# Patient Record
Sex: Male | Born: 1970 | Race: White | Hispanic: No | Smoking: Former smoker
Health system: Southern US, Community
[De-identification: ages and names within clinical notes are randomized; demographics above are authoritative.]

## PROBLEM LIST (undated history)

## (undated) DIAGNOSIS — M109 Gout, unspecified: Secondary | ICD-10-CM

## (undated) DIAGNOSIS — R7303 Prediabetes: Secondary | ICD-10-CM

## (undated) DIAGNOSIS — T7840XA Allergy, unspecified, initial encounter: Secondary | ICD-10-CM

## (undated) DIAGNOSIS — I10 Essential (primary) hypertension: Secondary | ICD-10-CM

## (undated) HISTORY — DX: Gout, unspecified: M10.9

## (undated) HISTORY — PX: WISDOM TOOTH EXTRACTION: SHX21

## (undated) HISTORY — DX: Prediabetes: R73.03

## (undated) HISTORY — DX: Allergy, unspecified, initial encounter: T78.40XA

---

## 2003-06-01 ENCOUNTER — Emergency Department (HOSPITAL_COMMUNITY): Admission: EM | Admit: 2003-06-01 | Discharge: 2003-06-01 | Payer: Self-pay | Admitting: Emergency Medicine

## 2004-12-26 ENCOUNTER — Ambulatory Visit (HOSPITAL_COMMUNITY): Admission: RE | Admit: 2004-12-26 | Discharge: 2004-12-26 | Payer: Self-pay | Admitting: Colon and Rectal Surgery

## 2006-03-03 HISTORY — PX: NASAL SEPTUM SURGERY: SHX37

## 2008-06-26 ENCOUNTER — Encounter: Admission: RE | Admit: 2008-06-26 | Discharge: 2008-06-26 | Payer: Self-pay | Admitting: Chiropractic Medicine

## 2010-07-19 NOTE — Op Note (Signed)
Andre Garrett, Andre Garrett                 ACCOUNT NO.:  [id number]   MEDICAL RECORD NO.:  [id number]          PATIENT TYPE:  AMB   LOCATION:  SDS                          FACILITY:  [facility]   PHYSICIAN:  Jefry H. Pollyann Kennedy, MD     DATE OF BIRTH:  [birth year]   DATE OF PROCEDURE:  12/26/2004  DATE OF DISCHARGE:  12/26/2004                                 OPERATIVE REPORT   PREOPERATIVE DIAGNOSIS:  Intractable epistaxis with septal deviation.   POSTOPERATIVE DIAGNOSIS:  Intractable epistaxis with septal deviation.   PROCEDURE:  Nasal septoplasty with control of epistaxis.   SURGEON:  Jefry H. Pollyann Kennedy, MD   HISTORY:  This is a 40 year old gentleman who presented to the office  earlier today with a 5-day history of intractable epistaxis from the right  side.  In the office I was able to identify the bleeding source as emanating  from underneath and lateral to the right inferior turbinate, although this  area was very difficult to visualize because of the tight space as well as a  significant septal deviation on the right side.  Risks, benefit,  alternatives and complications to the procedure were explained to the  patient, who seemed to understand and agreed to surgery.   PROCEDURE:  The patient was taken to the operating room and placed on the  table in a supine position.  Following induction of general endotracheal  anesthesia, the face was prepped and draped in a standard fashion.  Oxymetazoline spray was used preoperatively in the nasal cavities.  1%  Xylocaine with epinephrine was infiltrated into the septum, the columella  and inferior turbinates bilaterally.  Afrin-soaked pledgets were placed  bilaterally in the nasal cavities.  A left hemitransfixion incision was  created with a 15 scalpel and a mucoperichondrial flap was developed down  the left side of the nasal septum.  The bony cartilaginous junction was  divided and a similar flap was developed anteriorly on the right side.  The  spur  was comprised of the lower portion of the quadrangle cartilage and a  severely tilted maxillary crest.  All of the abnormal bone and some of the  quadrangle cartilage were removed to allow the septum to lie at the midline.  Suction cautery was used for hemostasis at the maxillary crest osteotomy  site.  Once the septum was able to lie at the midline, the septal incision  was reapproximated with chromic suture.  The septal flaps were then quilted  with plain gut.  The inferior turbinate was then infractured, allowing  greater access to the inferior surface.  The bleeding site was identified  and cauterized extensively with a suction cautery unit.  The inferior meatus  was then packed with Surgicel.  The turbinate was then outfractured to  compress the Surgicel and put pressure on the bleeding site.  The nasal  cavities were then packed with rolled-up Telfa coated with bacitracin.  The  pharynx was suctioned of blood and secretions.  The patient was then  awakened, extubated, transferred to Recovery.      Jefry H. Pollyann Kennedy, MD  Electronically Signed     JHR/MEDQ  D:  12/27/2004  T:  12/28/2004  Job:  [id number]

## 2010-07-26 ENCOUNTER — Emergency Department (HOSPITAL_COMMUNITY)
Admission: EM | Admit: 2010-07-26 | Discharge: 2010-07-26 | Disposition: A | Discharge status: Home / Self Care | Payer: Worker's Compensation | Attending: Emergency Medicine | Admitting: Emergency Medicine

## 2010-07-26 DIAGNOSIS — W268XXA Contact with other sharp object(s), not elsewhere classified, initial encounter: Secondary | ICD-10-CM | POA: Insufficient documentation

## 2010-07-26 DIAGNOSIS — Y9289 Other specified places as the place of occurrence of the external cause: Secondary | ICD-10-CM | POA: Insufficient documentation

## 2010-07-26 DIAGNOSIS — S61209A Unspecified open wound of unspecified finger without damage to nail, initial encounter: Secondary | ICD-10-CM | POA: Insufficient documentation

## 2010-07-26 DIAGNOSIS — I10 Essential (primary) hypertension: Secondary | ICD-10-CM | POA: Insufficient documentation

## 2012-08-21 ENCOUNTER — Emergency Department (HOSPITAL_COMMUNITY): Payer: Self-pay

## 2012-08-21 ENCOUNTER — Observation Stay (HOSPITAL_COMMUNITY)
Admission: EM | Admit: 2012-08-21 | Discharge: 2012-08-22 | Disposition: A | Discharge status: Home / Self Care | Payer: MEDICAID | Attending: Internal Medicine | Admitting: Internal Medicine

## 2012-08-21 ENCOUNTER — Encounter (HOSPITAL_COMMUNITY): Payer: Self-pay | Admitting: *Deleted

## 2012-08-21 DIAGNOSIS — R079 Chest pain, unspecified: Secondary | ICD-10-CM

## 2012-08-21 DIAGNOSIS — R911 Solitary pulmonary nodule: Secondary | ICD-10-CM | POA: Diagnosis present

## 2012-08-21 DIAGNOSIS — F17200 Nicotine dependence, unspecified, uncomplicated: Secondary | ICD-10-CM | POA: Insufficient documentation

## 2012-08-21 DIAGNOSIS — J309 Allergic rhinitis, unspecified: Secondary | ICD-10-CM | POA: Diagnosis present

## 2012-08-21 DIAGNOSIS — G4700 Insomnia, unspecified: Secondary | ICD-10-CM | POA: Insufficient documentation

## 2012-08-21 DIAGNOSIS — F1010 Alcohol abuse, uncomplicated: Secondary | ICD-10-CM | POA: Diagnosis present

## 2012-08-21 DIAGNOSIS — I10 Essential (primary) hypertension: Secondary | ICD-10-CM | POA: Diagnosis present

## 2012-08-21 DIAGNOSIS — G589 Mononeuropathy, unspecified: Secondary | ICD-10-CM | POA: Insufficient documentation

## 2012-08-21 DIAGNOSIS — R0789 Other chest pain: Principal | ICD-10-CM | POA: Insufficient documentation

## 2012-08-21 DIAGNOSIS — Z720 Tobacco use: Secondary | ICD-10-CM | POA: Diagnosis present

## 2012-08-21 HISTORY — DX: Essential (primary) hypertension: I10

## 2012-08-21 LAB — CBC WITH DIFFERENTIAL/PLATELET
Basophils Absolute: 0 10*3/uL (ref 0.0–0.1)
Basophils Relative: 1 % (ref 0–1)
HCT: 45.7 % (ref 39.0–52.0)
Lymphocytes Relative: 30 % (ref 12–46)
MCHC: 35.7 g/dL (ref 30.0–36.0)
Monocytes Absolute: 0.6 10*3/uL (ref 0.1–1.0)
Neutro Abs: 3.9 10*3/uL (ref 1.7–7.7)
Platelets: 166 10*3/uL (ref 150–400)
RDW: 13.3 % (ref 11.5–15.5)
WBC: 6.6 10*3/uL (ref 4.0–10.5)

## 2012-08-21 LAB — POCT I-STAT TROPONIN I: Troponin i, poc: 0.01 ng/mL (ref 0.00–0.08)

## 2012-08-21 LAB — HEPATIC FUNCTION PANEL
AST: 20 U/L (ref 0–37)
Albumin: 3.6 g/dL (ref 3.5–5.2)
Alkaline Phosphatase: 74 U/L (ref 39–117)
Total Bilirubin: 1 mg/dL (ref 0.3–1.2)

## 2012-08-21 LAB — BASIC METABOLIC PANEL
Calcium: 9.1 mg/dL (ref 8.4–10.5)
Chloride: 102 mEq/L (ref 96–112)
Creatinine, Ser: 0.88 mg/dL (ref 0.50–1.35)
GFR calc Af Amer: >90 mL/min (ref >90)
Sodium: 138 mEq/L (ref 135–145)

## 2012-08-21 LAB — CBC
HCT: 44.6 % (ref 39.0–52.0)
Hemoglobin: 15.6 g/dL (ref 13.0–17.0)
MCV: 94.1 fL (ref 78.0–100.0)
RBC: 4.74 MIL/uL (ref 4.22–5.81)
WBC: 8 10*3/uL (ref 4.0–10.5)

## 2012-08-21 LAB — RAPID URINE DRUG SCREEN, HOSP PERFORMED
Cocaine: NOT DETECTED
Opiates: POSITIVE — AB

## 2012-08-21 LAB — TROPONIN I: Troponin I: <0.3 ng/mL (ref <0.30)

## 2012-08-21 MED ORDER — FOLIC ACID 1 MG PO TABS
1.0000 mg | ORAL_TABLET | Freq: Every day | ORAL | Status: DC
  Administered 2012-08-21 – 2012-08-22 (×2): 1 mg via ORAL
  Filled 2012-08-21 (×2): qty 1

## 2012-08-21 MED ORDER — CARVEDILOL 12.5 MG PO TABS
12.5000 mg | ORAL_TABLET | Freq: Two times a day (BID) | ORAL | Status: DC
  Administered 2012-08-21 – 2012-08-22 (×2): 12.5 mg via ORAL
  Filled 2012-08-21 (×4): qty 1

## 2012-08-21 MED ORDER — ACETAMINOPHEN 325 MG PO TABS: 650.0000 mg | ORAL_TABLET | Freq: Four times a day (QID) | ORAL | Status: DC | PRN

## 2012-08-21 MED ORDER — METOPROLOL TARTRATE 25 MG PO TABS
25.0000 mg | ORAL_TABLET | Freq: Two times a day (BID) | ORAL | Status: DC
  Administered 2012-08-21: 25 mg via ORAL
  Filled 2012-08-21 (×4): qty 1

## 2012-08-21 MED ORDER — VITAMIN B-1 100 MG PO TABS
100.0000 mg | ORAL_TABLET | Freq: Every day | ORAL | Status: DC
  Administered 2012-08-21 – 2012-08-22 (×2): 100 mg via ORAL
  Filled 2012-08-21 (×2): qty 1

## 2012-08-21 MED ORDER — LORAZEPAM 2 MG/ML IJ SOLN: 0.0000 mg | Freq: Four times a day (QID) | INTRAMUSCULAR | Status: DC

## 2012-08-21 MED ORDER — LORAZEPAM 1 MG PO TABS: 1.0000 mg | ORAL_TABLET | Freq: Four times a day (QID) | ORAL | Status: DC | PRN

## 2012-08-21 MED ORDER — SODIUM CHLORIDE 0.9 % IJ SOLN
3.0000 mL | Freq: Two times a day (BID) | INTRAMUSCULAR | Status: DC
  Administered 2012-08-21: 3 mL via INTRAVENOUS

## 2012-08-21 MED ORDER — ASPIRIN EC 81 MG PO TBEC
81.0000 mg | DELAYED_RELEASE_TABLET | Freq: Every day | ORAL | Status: DC
  Administered 2012-08-22: 81 mg via ORAL
  Filled 2012-08-21: qty 1

## 2012-08-21 MED ORDER — PANTOPRAZOLE SODIUM 40 MG PO TBEC: 40.0000 mg | DELAYED_RELEASE_TABLET | Freq: Every day | ORAL | Status: DC

## 2012-08-21 MED ORDER — NICOTINE 21 MG/24HR TD PT24
21.0000 mg | MEDICATED_PATCH | Freq: Every day | TRANSDERMAL | Status: DC
  Administered 2012-08-21 – 2012-08-22 (×2): 21 mg via TRANSDERMAL
  Filled 2012-08-21 (×2): qty 1

## 2012-08-21 MED ORDER — ONDANSETRON HCL 4 MG PO TABS: 4.0000 mg | ORAL_TABLET | Freq: Four times a day (QID) | ORAL | Status: DC | PRN

## 2012-08-21 MED ORDER — ACETAMINOPHEN 650 MG RE SUPP: 650.0000 mg | Freq: Four times a day (QID) | RECTAL | Status: DC | PRN

## 2012-08-21 MED ORDER — OXYCODONE HCL 5 MG PO TABS: 5.0000 mg | ORAL_TABLET | ORAL | Status: DC | PRN

## 2012-08-21 MED ORDER — HEPARIN SODIUM (PORCINE) 5000 UNIT/ML IJ SOLN
5000.0000 [IU] | Freq: Three times a day (TID) | INTRAMUSCULAR | Status: DC
  Administered 2012-08-21 – 2012-08-22 (×3): 5000 [IU] via SUBCUTANEOUS
  Filled 2012-08-21 (×6): qty 1

## 2012-08-21 MED ORDER — NITROGLYCERIN 0.4 MG SL SUBL
0.4000 mg | SUBLINGUAL_TABLET | SUBLINGUAL | Status: DC | PRN
  Administered 2012-08-21: 0.4 mg via SUBLINGUAL

## 2012-08-21 MED ORDER — VITAMIN B-1 100 MG PO TABS: 100.0000 mg | ORAL_TABLET | Freq: Every day | ORAL | Status: DC

## 2012-08-21 MED ORDER — ADULT MULTIVITAMIN W/MINERALS CH
1.0000 | ORAL_TABLET | Freq: Every day | ORAL | Status: DC
  Administered 2012-08-21 – 2012-08-22 (×2): 1 via ORAL
  Filled 2012-08-21 (×2): qty 1

## 2012-08-21 MED ORDER — LORATADINE 10 MG PO TABS
10.0000 mg | ORAL_TABLET | Freq: Every day | ORAL | Status: DC
  Administered 2012-08-22: 10 mg via ORAL
  Filled 2012-08-21: qty 1

## 2012-08-21 MED ORDER — THIAMINE HCL 100 MG/ML IJ SOLN
100.0000 mg | Freq: Every day | INTRAMUSCULAR | Status: DC
  Filled 2012-08-21 (×2): qty 1

## 2012-08-21 MED ORDER — ONDANSETRON HCL 4 MG/2ML IJ SOLN: 4.0000 mg | Freq: Four times a day (QID) | INTRAMUSCULAR | Status: DC | PRN

## 2012-08-21 MED ORDER — MORPHINE SULFATE 2 MG/ML IJ SOLN: 1.0000 mg | INTRAMUSCULAR | Status: DC | PRN

## 2012-08-21 MED ORDER — MORPHINE SULFATE 4 MG/ML IJ SOLN
4.0000 mg | Freq: Once | INTRAMUSCULAR | Status: AC
  Administered 2012-08-21: 4 mg via INTRAVENOUS
  Filled 2012-08-21: qty 1

## 2012-08-21 MED ORDER — LORAZEPAM 2 MG/ML IJ SOLN: 1.0000 mg | Freq: Four times a day (QID) | INTRAMUSCULAR | Status: DC | PRN

## 2012-08-21 MED ORDER — LORAZEPAM 2 MG/ML IJ SOLN: 0.0000 mg | Freq: Two times a day (BID) | INTRAMUSCULAR | Status: DC

## 2012-08-21 MED ORDER — ZOLPIDEM TARTRATE 5 MG PO TABS
5.0000 mg | ORAL_TABLET | Freq: Every evening | ORAL | Status: DC | PRN
  Administered 2012-08-21: 5 mg via ORAL
  Filled 2012-08-21: qty 1

## 2012-08-21 NOTE — ED Notes (Signed)
Family at bedside. 

## 2012-08-21 NOTE — ED Notes (Signed)
Pt states, "I got a weird feeling. It's like my whole body is cold. I don't like that feeling."   RN at bedside to continue to monitor reaction to morphine and sl ntg

## 2012-08-21 NOTE — Consult Note (Signed)
CARDIOLOGY CONSULT NOTE    Patient ID: Andre Garrett MRN: [id number] DOB/AGE: Jun 08, 1970 42 y.o.  Admit date: 08/21/2012 Referring Physician:  Gwenlyn Perking Primary Physician: No primary provider on file. 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 Had radiation to left arm and some diaphoresis.  Also had tingling in finger tips and right leg.  BP a bit high Not clear if nitro helped. Worst type bear hugging pain during ECG which was normal. No previous cardiac disease. Fathers side of family with premature CAD and mothers side with DM.  Does not see a doctor usually and has no insurance.  Currently feels ok Had some dyspnea as well Pain not positional or pleuritic ECG and POC markers negative CXR normal and mediastinum normal  @ROS @ All other systems reviewed and negative except as noted above  Past Medical History  Diagnosis Date  . Hypertension     History reviewed. No pertinent family history.  History   Social History  . Marital Status: Married    Spouse Name: N/A    Number of Children: N/A  . Years of Education: N/A   Occupational History  . Not on file.   Social History Main Topics  . Smoking status: Current Every Day Smoker    Types: Cigarettes  . Smokeless tobacco: Not on file  . Alcohol Use: Yes  . Drug Use: Not on file  . Sexually Active: Not on file   Other Topics Concern  . Not on file   Social History Narrative  . No narrative on file    History reviewed. No pertinent past surgical history.   . metoprolol tartrate  25 mg Oral BID      Physical Exam: Blood pressure 147/87, pulse 66, temperature 98.2 F (36.8 C), temperature source Oral, resp. rate 18, SpO2 99.00%.   Affect  appropriate Healthy:  appears stated age HEENT: normal Neck supple with no adenopathy JVP normal no bruits no thyromegaly Lungs clear with no wheezing and good diaphragmatic motion Heart:  S1/S2 no murmur, no rub, gallop or click PMI normal Abdomen: benighn, BS positve, no tenderness, no AAA no bruit.  No HSM or HJR Distal pulses intact with no bruits No edema Neuro non-focal Skin warm and dry multiple tatoos No muscular weakness   Labs:   Lab Results  Component Value Date   WBC 6.6 08/21/2012   HGB 16.3 08/21/2012   HCT 45.7 08/21/2012   MCV 92.9 08/21/2012   PLT 166 08/21/2012    Recent Labs Lab 08/21/12 1321  NA 138  K 3.7  CL 102  CO2 25  BUN 17  CREATININE 0.88  CALCIUM 9.1  GLUCOSE 95      Radiology: Dg Chest Port 1 View  08/21/2012   *RADIOLOGY REPORT*  Clinical Data: Chest pain.  CHEST - 1 VIEW  Comparison:  06/01/2003  Findings: The heart size and mediastinal contours are within normal limits.  Both lungs are clear.  IMPRESSION: No active disease.   Original Report Authenticated By: Irish Lack, M.D.    EKG: NSR normal ECG   ASSESSMENT AND PLAN:  Chest Pain:  Severe episode in smoker Concerning but no ECG changes with worst pain. Patient concerned about LOS  with no insurance.  Initially recommended keeping until Monday for cath but he did not really want to stay all week end.  Will try to do Cardiac CT first thing in am since he is thin and should  Have a diagnostic scan.  Start 18 g iv and give lopressor tonight and in am  ASA  Heparin for recurrent pain If enzymes turn positive or ECG changes will have to keep until Monday for cath.    SignedCharlton Haws 08/21/2012, 4:37 PM

## 2012-08-21 NOTE — H&P (Signed)
[facility] History and Physical  Andre Garrett ZOX:[id number] DOB: [birth year] DOA: 08/21/2012  Referring physician: Dr. Silverio Lay PCP: No primary provider on file.   Chief Complaint: chest pain.  HPI: Andre Garrett is a 42 y.o. male with pmh significant for tobacco abuse, alcohol abuse and HTN; came to ED secondary to sudden onset of chest pain. Patient reports pain in the middle of his chest radiated to left arm, pressure like in nature, 10/10 in intensity, lasted approx for 20-30 minutes and relieved with use of NTG and ASA. Patient reports strong family heart history on his father side and diabetes on his mother. He do not follow regularly with anyone. In ED first set CE'z neg, EKG w/o acute ischemic changes. [facility] called to admit patient for further evaluation and treatment.  Patient denies cough, fever, chills, nausea, vomiting, abd pain, melena and recent use of NSAID's.  Review of Systems:  Positive for tingling/burning sensation on his LE's; otherwise neg except as mentioned on HPI.   Past Medical History  Diagnosis Date  . Hypertension    History reviewed. No pertinent past surgical history. Social History:  reports that he has been smoking Cigarettes.  He has been smoking about 2.0 packs per day. He does not have any smokeless tobacco history on file. He reports that drinks alcohol (1 six pack or more daily). Denies illicit drug use. Came from home and reports no need of assistance for ADL's  Allergies  Allergen Reactions  . Shellfish Allergy Nausea And Vomiting    Family Hx: significant family hx for heart problems on his father side and also diabetes on her mother.  Prior to Admission medications   Medication Sig Start Date End Date Taking? Authorizing Provider  aspirin EC 81 MG tablet Take 81 mg by mouth daily.   Yes Historical Provider, MD  loratadine (CLARITIN) 10 MG tablet Take 10 mg by mouth daily.   Yes Historical Provider, MD   Physical Exam: Filed Vitals:   08/21/12 1430 08/21/12 1500 08/21/12 1530 08/21/12 1551  BP: 127/86 135/83 131/89 150/99  Pulse: 62 61 61 76  Temp:      TempSrc:      Resp: 16 13 14 19   SpO2: 97% 99% 98% 99%     General:  Afebrile, complaining of some chest heaviness but not frank CP as he was earlier on arrival to ED. Cooperative to examination  Eyes: no icterus, no nystagmus, PERRL, EOMI  ENT: moist MM< no erythema or exudate inside his mouth, no drainage out of ears or nostrils  Neck: no JVD, no thyromegaly, no bruits  Cardiovascular: S1 and S2, no rubs, no murmurs, no gallops  Respiratory: CTA bilaterally  Abdomen: soft, NT, ND, positive BS  Skin: multiple tattoos, no open wounds, no rash or petechiae  Musculoskeletal: no joint swelling or erythema  Psychiatric: no hallucinations, no SI; slight anxious mainly due to cost of hospitalization.  Neurologic: AAOX3, CN intact, MS 5/5 bilaterally, no focal motro or sensory deficit on exam. Patient reports some tingling/burning sensation on his legs.  Labs on Admission:  Basic Metabolic Panel:  Recent Labs Lab 08/21/12 1321  NA 138  K 3.7  CL 102  CO2 25  GLUCOSE 95  BUN 17  CREATININE 0.88  CALCIUM 9.1   CBC:  Recent Labs Lab 08/21/12 1321  WBC 6.6  NEUTROABS 3.9  HGB 16.3  HCT 45.7  MCV 92.9  PLT 166    Radiological Exams on Admission: Dg Chest [facility]  1 View  08/21/2012   *RADIOLOGY REPORT*  Clinical Data: Chest pain.  CHEST - 1 VIEW  Comparison:  06/01/2003  Findings: The heart size and mediastinal contours are within normal limits.  Both lungs are clear.  IMPRESSION: No active disease.   Original Report Authenticated By: Irish Lack, M.D.    EKG:  Rate: 78  Rhythm: normal sinus rhythm  QRS Axis: normal  Intervals: normal  ST/T Wave abnormalities: normal  Conduction Disutrbances:none  Narrative Interpretation:  Old EKG Reviewed: none available  Assessment/Plan 1-Chest pain: patient with CP ss, radiated to his arm and with  associated diaphoresis and lightheadedness. Risk factors includes, (age, gender, tobacco abuse, HTN) -will admit to telemetry -ASA, B-blocker, PRN nitrates and oxygen -cycle cardiac enzymes and EKG -check 2- D echo -will ask cardiology to see as patient might require stress test for stratification -will check A1C (family hx for diabetes) and lipid panel -CXR neg for PNA, will check lipase, start PPI  2-Tobacco abuse: cessation counseling provided. Will use nicotine patch  3-Alcohol abuse: which can lead to gastritis and GERD for CP as well.  -will use CIWA protocol -start PPI -patient received cessation counseling.  4-HTN (hypertension): will use coreg  5-Allergic rhinitis: will continue claritin  6-neuropathy: will check TSH and B12  7-Insomnia: PRN zolpidem  DVT: heparin.  Cardiology (Dr. Eden Emms)  Code Status: Full Family Communication: no family at bedside Disposition Plan: observation, telemetry bed, LOS < 2 midnights  Time spent: 50 minutes  Kymere Fullington [facility] Pager [phone]  If 7PM-7AM, please contact night-coverage [email or web address] Password [facility] 08/21/2012, 4:00 PM

## 2012-08-21 NOTE — ED Provider Notes (Signed)
Medical screening examination/treatment/procedure(s) were conducted as a shared visit with non-physician practitioner(s) and myself.  I personally evaluated the patient during the encounter  Andre Garrett is a 42 y.o. male with family hx of CAD here with chest pain, SOB. Started this AM, felt better with nitro and ASA in the ED. EKG unremarkable. Trop neg x 1. However, he has hx of HTN, smoking as well. Given high risk for ACS will admit for r/o.    Richardean Canal, MD 08/21/12 [id number]

## 2012-08-21 NOTE — ED Provider Notes (Signed)
History     CSN: [id number]  Arrival date & time 08/21/12  1311   First MD Initiated Contact with Patient 08/21/12 1320      Chief Complaint  Patient presents with  . Chest Pain    (Consider location/radiation/quality/duration/timing/severity/associated sxs/prior treatment) HPI Comments: Patient presents to the emergency department with chief complaint of chest pain and chest tightness. He states that approximately one hour ago, he noticed severe chest tightness accompanied with shortness of breath, and diaphoresis. He states that the tightness radiates to his left arm. He has an extensive family history of heart disease, he also has positive risk factors including smoking, and hypertension. He states that he took an aspirin at home when he first began having symptoms. He rates his symptoms as moderate to severe. He has not ever experienced this before  The history is provided by the patient. No language interpreter was used.    Past Medical History  Diagnosis Date  . Hypertension     History reviewed. No pertinent past surgical history.  History reviewed. No pertinent family history.  History  Substance Use Topics  . Smoking status: Current Every Day Smoker    Types: Cigarettes  . Smokeless tobacco: Not on file  . Alcohol Use: Yes      Review of Systems  All other systems reviewed and are negative.    Allergies  Review of patient's allergies indicates no known allergies.  Home Medications  No current outpatient prescriptions on file.  BP 148/87  Pulse 69  Temp(Src) 98.9 F (37.2 C) (Oral)  Resp 16  SpO2 96%  Physical Exam  Nursing note and vitals reviewed. Constitutional: He is oriented to person, place, and time. He appears well-developed and well-nourished.  HENT:  Head: Normocephalic and atraumatic.  Eyes: Conjunctivae and EOM are normal. Pupils are equal, round, and reactive to light. Right eye exhibits no discharge. Left eye exhibits no discharge.  No scleral icterus.  Neck: Normal range of motion. Neck supple. No JVD present.  Cardiovascular: Normal rate, regular rhythm, normal heart sounds and intact distal pulses.  Exam reveals no gallop and no friction rub.   No murmur heard. Pulmonary/Chest: Effort normal and breath sounds normal. No respiratory distress. He has no wheezes. He has no rales. He exhibits no tenderness.  Abdominal: Soft. Bowel sounds are normal. He exhibits no distension and no mass. There is no tenderness. There is no rebound and no guarding.  Musculoskeletal: Normal range of motion. He exhibits no edema and no tenderness.  Neurological: He is alert and oriented to person, place, and time. He has normal reflexes.  CN 3-12 intact  Skin: Skin is warm and dry.  Psychiatric: He has a normal mood and affect. His behavior is normal. Judgment and thought content normal.    ED Course  Procedures (including critical care time)  Labs Reviewed  CBC WITH DIFFERENTIAL  BASIC METABOLIC PANEL   Results for orders placed during the hospital encounter of 08/21/12  CBC WITH DIFFERENTIAL      Result Value Range   WBC 6.6  4.0 - 10.5 K/uL   RBC 4.92  4.22 - 5.81 MIL/uL   Hemoglobin 16.3  13.0 - 17.0 g/dL   HCT 78.2  95.6 - 21.3 %   MCV 92.9  78.0 - 100.0 fL   MCH 33.1  26.0 - 34.0 pg   MCHC 35.7  30.0 - 36.0 g/dL   RDW 08.6  57.8 - 46.9 %   Platelets 166  150 -  400 K/uL   Neutrophils Relative % 60  43 - 77 %   Neutro Abs 3.9  1.7 - 7.7 K/uL   Lymphocytes Relative 30  12 - 46 %   Lymphs Abs 2.0  0.7 - 4.0 K/uL   Monocytes Relative 9  3 - 12 %   Monocytes Absolute 0.6  0.1 - 1.0 K/uL   Eosinophils Relative 2  0 - 5 %   Eosinophils Absolute 0.1  0.0 - 0.7 K/uL   Basophils Relative 1  0 - 1 %   Basophils Absolute 0.0  0.0 - 0.1 K/uL  BASIC METABOLIC PANEL      Result Value Range   Sodium 138  135 - 145 mEq/L   Potassium 3.7  3.5 - 5.1 mEq/L   Chloride 102  96 - 112 mEq/L   CO2 25  19 - 32 mEq/L   Glucose, Bld 95  70  - 99 mg/dL   BUN 17  6 - 23 mg/dL   Creatinine, Ser 8.29  0.50 - 1.35 mg/dL   Calcium 9.1  8.4 - 56.2 mg/dL   GFR calc non Af Amer >90  >90 mL/min   GFR calc Af Amer >90  >90 mL/min  POCT I-STAT TROPONIN I      Result Value Range   Troponin i, poc 0.01  0.00 - 0.08 ng/mL   Comment 3            Dg Chest Port 1 View  08/21/2012   *RADIOLOGY REPORT*  Clinical Data: Chest pain.  CHEST - 1 VIEW  Comparison:  06/01/2003  Findings: The heart size and mediastinal contours are within normal limits.  Both lungs are clear.  IMPRESSION: No active disease.   Original Report Authenticated By: Irish Lack, M.D.     ED ECG REPORT  I personally interpreted this EKG   Date: 08/21/2012   Rate: 78  Rhythm: normal sinus rhythm  QRS Axis: normal  Intervals: normal  ST/T Wave abnormalities: normal  Conduction Disutrbances:none  Narrative Interpretation:   Old EKG Reviewed: none available    1. Chest pain       MDM  Patient with chest pain, shortness of breath, and diaphoresis. He has taken aspirin. Will give sublingual nitroglycerin, oxygen, and morphine.  Will re-evaluate.  Labs, chest x-ray, and EKG are reassuring, however his story is very concerning. Risk factors include hypertension, smoking, and family history. Will admit the patient for ACS rule out.  3:15 PM Patient will be admitted to [facility] for ACS rule out.  Team 8, tele.      Roxy Horseman, PA-C 08/21/12 1516

## 2012-08-21 NOTE — ED Notes (Addendum)
Pt reports onset this am of tingling to both legs, and tightness to his chest, became lightheaded and diaphoretic. ekg done at triage. Pt states "i feel really weird."

## 2012-08-21 NOTE — ED Notes (Addendum)
Pt states he had sudden onset of crushing chest pain around 1230 today. "It felt like someone put a ton of bricks on my chest."  CP resolved within the last 10 minutes. Pt also diaphoretic, lightheaded, short of breath during cp. Hx htn. Pt took 325mg  ASA at home

## 2012-08-22 ENCOUNTER — Observation Stay (HOSPITAL_COMMUNITY): Payer: Self-pay

## 2012-08-22 DIAGNOSIS — R079 Chest pain, unspecified: Secondary | ICD-10-CM

## 2012-08-22 DIAGNOSIS — R911 Solitary pulmonary nodule: Secondary | ICD-10-CM | POA: Diagnosis present

## 2012-08-22 LAB — LIPID PANEL
LDL Cholesterol: 47 mg/dL (ref 0–99)
Total CHOL/HDL Ratio: 1.8 RATIO

## 2012-08-22 LAB — TSH: TSH: 2.983 u[IU]/mL (ref 0.350–4.500)

## 2012-08-22 LAB — CBC
Hemoglobin: 15.9 g/dL (ref 13.0–17.0)
MCH: 32.4 pg (ref 26.0–34.0)
MCHC: 34.3 g/dL (ref 30.0–36.0)
MCV: 94.5 fL (ref 78.0–100.0)

## 2012-08-22 LAB — HEMOGLOBIN A1C: Mean Plasma Glucose: 108 mg/dL (ref <117)

## 2012-08-22 LAB — BASIC METABOLIC PANEL
BUN: 18 mg/dL (ref 6–23)
Calcium: 8.7 mg/dL (ref 8.4–10.5)
GFR calc non Af Amer: >90 mL/min (ref >90)
Glucose, Bld: 107 mg/dL — ABNORMAL HIGH (ref 70–99)
Potassium: 4.6 mEq/L (ref 3.5–5.1)

## 2012-08-22 LAB — VITAMIN B12: Vitamin B-12: 441 pg/mL (ref 211–911)

## 2012-08-22 LAB — TROPONIN I: Troponin I: <0.3 ng/mL (ref <0.30)

## 2012-08-22 MED ORDER — IOHEXOL 350 MG/ML SOLN
80.0000 mL | Freq: Once | INTRAVENOUS | Status: AC | PRN
  Administered 2012-08-22: 80 mL via INTRAVENOUS

## 2012-08-22 MED ORDER — METOPROLOL TARTRATE 1 MG/ML IV SOLN
INTRAVENOUS | Status: AC
  Filled 2012-08-22: qty 5

## 2012-08-22 MED ORDER — NITROGLYCERIN 0.4 MG SL SUBL: 0.4000 mg | SUBLINGUAL_TABLET | SUBLINGUAL | Status: DC | PRN

## 2012-08-22 MED ORDER — NITROGLYCERIN 0.4 MG SL SUBL
SUBLINGUAL_TABLET | SUBLINGUAL | Status: AC
  Filled 2012-08-22: qty 25

## 2012-08-22 MED ORDER — CARVEDILOL 12.5 MG PO TABS: 12.5000 mg | ORAL_TABLET | Freq: Two times a day (BID) | ORAL | Status: DC

## 2012-08-22 NOTE — Progress Notes (Addendum)
   CARE MANAGEMENT NOTE 08/22/2012  Patient:  Andre Garrett, Andre Garrett   Account Number:  [id number]  Date Initiated:  08/22/2012  Documentation initiated by:  [facility]  Subjective/Objective Assessment:   ETOH abuse     Action/Plan:   Anticipated DC Date:  08/22/2012   Anticipated DC Plan:  HOME/SELF CARE      DC Planning Services  CM consult  PCP issues      Choice offered to / List presented to:             Status of service:  Completed, signed off Medicare Important Message given?   (If response is "NO", the following Medicare IM given date fields will be blank) Date Medicare IM given:   Date Additional Medicare IM given:    Discharge Disposition:  HOME/SELF CARE  Per UR Regulation:    If discussed at Long Length of Stay Meetings, dates discussed:    Comments:  08/22/2012 11:00 AM  NCM spoke to pt and provided him with info on [employer] and [employer].  States he currently does not have insurance coverage. Explained to him to call Monday and schedule a follow up appt. Pt had questions about Hgb A1C and will discuss with his attending. States he can afford his medications. Isidoro Donning RN CCM Case Mgmt phone [phone]

## 2012-08-22 NOTE — Progress Notes (Signed)
Utilization Review Completed.Travoris Bushey T6/22/2014  

## 2012-08-22 NOTE — Discharge Summary (Signed)
Physician Discharge Summary  Patient ID: Andre Garrett MRN: [id number] DOB/AGE: [birth year] 42 y.o.  Admit date: 08/21/2012 Discharge date: 08/22/2012  Primary Care Physician:  No primary provider on file.  Discharge Diagnoses:    . atypical Chest pain- resolved  . Alcohol abuse . Tobacco abuse . HTN (hypertension) . Allergic rhinitis   Lung Nodule- right lung  Consults: [facility]   Recommendations for Outpatient Follow-up:  Patient needs followup CT chest 6 months-1 year for the lung nodule Patient was advised to stop smoking and drinking alcohol  Allergies:   Allergies  Allergen Reactions  . Shellfish Allergy Nausea And Vomiting     Discharge Medications:   Medication List    TAKE these medications       aspirin EC 81 MG tablet  Take 81 mg by mouth daily.     carvedilol 12.5 MG tablet  Commonly known as:  COREG  Take 1 tablet (12.5 mg total) by mouth 2 (two) times daily with a meal.     loratadine 10 MG tablet  Commonly known as:  CLARITIN  Take 10 mg by mouth daily.     nitroGLYCERIN 0.4 MG SL tablet  Commonly known as:  NITROSTAT  Place 1 tablet (0.4 mg total) under the tongue every 5 (five) minutes x 3 doses as needed for chest pain.                  Brief H and P: For complete details please refer to admission H and P, but in brief Andre Garrett is a 42 y.o. male with pmh significant for tobacco abuse, alcohol abuse and HTN; came to ED secondary to sudden onset of chest pain. Patient reported pain in the middle of his chest radiated to left arm, pressure like in nature, 10/10 in intensity, lasted approx for 20-30 minutes and relieved with use of NTG and ASA. Patient reported strong family heart history on his father side and diabetes on his mother. He do not follow regularly with anyone. In ED first set CE'z neg, EKG w/o acute ischemic changes.   Hospital Course:     Chest pain- atypical. Patient was admitted to telemetry for rule out  acute ACS. Cardiac enzymes were negative. Given his risk factors of nicotine abuse, family history, cardiology was consulted for assistance. Patient underwent cardiac CT which was negative for any coronary artery disease, calcium score 0, normal right dominant coronary arteries.     Alcohol abuse and  Tobacco abuse: Patient was counseled extensively for alcohol and tobacco cessation    HTN (hypertension): He was started on Coreg     Lung nodule: Incidentally, cardiac CT also showed 2 small well circumscribed nodules 4x38mm and 4x56mm which may be post inflammatory in nature. However given his smoking history, needs follow-up CT chest in 6 months to 12 months. Patient was strongly advised to quit smoking. case management consult was called to provide him resources for obtaining primary care physician. Patient was also advised to be proactive in obtaining primary care physician and followup CT chest.  Day of Discharge BP 130/90  Pulse 65  Temp(Src) 97.8 F (36.6 C) (Oral)  Resp 16  Ht 5\' 7"  (1.702 m)  Wt 65.318 kg (144 lb)  BMI 22.55 kg/m2  SpO2 97%  Physical Exam: General: Alert and awake oriented x3 not in any acute distress. CVS: S1-S2 clear no murmur rubs or gallops Chest: clear to auscultation bilaterally, no wheezing rales or rhonchi Abdomen: soft nontender, nondistended,  normal bowel sounds,  Extremities: no cyanosis, clubbing or edema noted bilaterally Neuro: Cranial nerves II-XII intact, no focal neurological deficits   The results of significant diagnostics from this hospitalization (including imaging, microbiology, ancillary and laboratory) are listed below for reference.    LAB RESULTS: Basic Metabolic Panel:  Recent Labs Lab 08/21/12 1321 08/21/12 1730 08/22/12 0545  NA 138  --  134*  K 3.7  --  4.6  CL 102  --  101  CO2 25  --  26  GLUCOSE 95  --  107*  BUN 17  --  18  CREATININE 0.88 0.85 0.88  CALCIUM 9.1  --  8.7   Liver Function Tests:  Recent Labs Lab  08/21/12 1730  AST 20  ALT 14  ALKPHOS 74  BILITOT 1.0  PROT 6.3  ALBUMIN 3.6    Recent Labs Lab 08/21/12 1601  LIPASE 65*   No results found for this basename: AMMONIA,  in the last 168 hours CBC:  Recent Labs Lab 08/21/12 1321 08/21/12 1730 08/22/12 0545  WBC 6.6 8.0 7.6  NEUTROABS 3.9  --   --   HGB 16.3 15.6 15.9  HCT 45.7 44.6 46.3  MCV 92.9 94.1 94.5  PLT 166 164 174   Cardiac Enzymes:  Recent Labs Lab 08/21/12 2210 08/22/12 0545  TROPONINI <0.30 <0.30   BNP: No components found with this basename: POCBNP,  CBG: No results found for this basename: GLUCAP,  in the last 168 hours  Significant Diagnostic Studies:  Ct Coronary Morp W/cta Cor W/score W/ca W/cm &/or Wo/cm  08/22/2012   OVER-READ INTERPRETATION - CT CHEST  The following report is an over-read performed by radiologist Dr. Sherrine Maples T. Fredia Sorrow, M.D. of [facility], [place] on 08/22/2012 09:05:35.  This over-read does not include interpretation of cardiac or coronary anatomy or pathology.  The CTA interpretation by the cardiologist is attached.  Comparison:  None  Findings: Mild scarring/atelectasis is present at both posterior lung bases.  There is a subpleural nodule in the right lower lobe on image number 30 of lung windows which is well circumscribed and measures approximately 4 x 7 mm.  Additional smaller nodule along the lateral aspect of the right major fissure measures approximately 4 x 5 mm.  Both nodules are noncalcified.  No enlarged lymph nodes are seen.  IMPRESSION: Incidental detection of two separate right lower lobe lung nodules which are small and well circumscribed and may be post inflammatory in nature.  Follow-up is recommended based on Fleischner criteria.  If the patient is at high risk for bronchogenic carcinoma, follow- up chest CT at 3-6 months is recommended.  If the patient is at low risk for bronchogenic carcinoma, follow-up chest CT at 6-12 months is recommended.  This  recommendation follows the consensus statement: Guidelines for Management of Small Pulmonary Nodules Detected on CT Scans: A Statement from the Fleischner Society as published in Radiology 2005; 237:395-400.  Cardiac CT:  Indication: Chest Pain  Protocol:  The patient was scanned on a Philips 256 scanner. Gantry rotation speed was 270 msec.  Collimation was .9mm.  The patient received 5 mg of iv lopressor .  Average HR was 65 bpm. After initial AP and lateral scouts and 3mm axial noncontrast was done through the heart for calcium scoring using the Agatson method.  The patient then received 80cc of contrast with a prospective scan triggered in the descending thoracic aorta at 111 HU's.  Radiation dose was minimized using idose 3 and  reducing mA to 300.  A lead shield was used below the waist  Findings:  Non-cardiac See report from Dr Fredia Sorrow regarding right lung nodules  Calcium Score : 0  Coronary Arteries: Right dominant with no anomalies  LM- Normal  LAD- normal        D1- normal  Circumflex- small and normal primarily AV groove branch        OM1- normal  RCA- dominant and normal        PDA: normal        PLA: normal  Aorta normal with no discection 2.9 cm  Impression:     1)    Calcium Score 0        2)    Normal right dominant coronary arteries 3)    See radiology note regarding f/u of right lung nodules Charlton Haws MD [facility]   Original Report Authenticated By: Irish Lack, M.D.   Dg Chest Port 1 View  08/21/2012   *RADIOLOGY REPORT*  Clinical Data: Chest pain.  CHEST - 1 VIEW  Comparison:  06/01/2003  Findings: The heart size and mediastinal contours are within normal limits.  Both lungs are clear.  IMPRESSION: No active disease.   Original Report Authenticated By: Irish Lack, M.D.    2D ECHO:   Disposition and Follow-up: Discharge Orders   Future Orders Complete By Expires     Diet - low sodium heart healthy  As directed     Discharge instructions  As directed     Comments:      Please  note that you have 2 small right lung nodules (4x64mm, 4x 5mm) that need to be followed closely by repeat CT scan of the chest in 6-12 months. Please obtain PCP for followup.    Increase activity slowly  As directed         DISPOSITION: Home DIET: Heart healthy diet ACTIVITY: As tolerated TESTS THAT NEED FOLLOW-UP CT chest in 6-12 months  DISCHARGE FOLLOW-UP Follow-up Information   Schedule an appointment as soon as possible for a visit in 2 weeks to follow up. (for hospital follow-up)    Contact information:   please follow-up with PCP      Time spent on Discharge: 33 mins  Signed:   Eliyohu Class M.D. [facility] 08/22/2012, 10:28 AM Pager: [phone]

## 2018-02-16 ENCOUNTER — Other Ambulatory Visit: Payer: Self-pay

## 2018-02-16 ENCOUNTER — Emergency Department (HOSPITAL_COMMUNITY): Payer: Self-pay

## 2018-02-16 ENCOUNTER — Encounter (HOSPITAL_COMMUNITY): Payer: Self-pay

## 2018-02-16 ENCOUNTER — Emergency Department (HOSPITAL_COMMUNITY)
Admission: EM | Admit: 2018-02-16 | Discharge: 2018-02-16 | Disposition: A | Discharge status: Home / Self Care | Payer: Self-pay | Attending: Emergency Medicine | Admitting: Emergency Medicine

## 2018-02-16 DIAGNOSIS — F17210 Nicotine dependence, cigarettes, uncomplicated: Secondary | ICD-10-CM | POA: Insufficient documentation

## 2018-02-16 DIAGNOSIS — I10 Essential (primary) hypertension: Secondary | ICD-10-CM | POA: Insufficient documentation

## 2018-02-16 DIAGNOSIS — F1010 Alcohol abuse, uncomplicated: Secondary | ICD-10-CM | POA: Insufficient documentation

## 2018-02-16 DIAGNOSIS — Z79899 Other long term (current) drug therapy: Secondary | ICD-10-CM | POA: Insufficient documentation

## 2018-02-16 DIAGNOSIS — R079 Chest pain, unspecified: Secondary | ICD-10-CM | POA: Insufficient documentation

## 2018-02-16 LAB — I-STAT TROPONIN, ED
Troponin i, poc: 0.01 ng/mL (ref 0.00–0.08)
Troponin i, poc: 0.05 ng/mL (ref 0.00–0.08)

## 2018-02-16 LAB — CBC
HCT: 47.8 % (ref 39.0–52.0)
Hemoglobin: 15.8 g/dL (ref 13.0–17.0)
MCH: 30.9 pg (ref 26.0–34.0)
MCHC: 33.1 g/dL (ref 30.0–36.0)
MCV: 93.4 fL (ref 80.0–100.0)
Platelets: 226 10*3/uL (ref 150–400)
RBC: 5.12 MIL/uL (ref 4.22–5.81)
RDW: 13.4 % (ref 11.5–15.5)
WBC: 6.2 10*3/uL (ref 4.0–10.5)
nRBC: 0 % (ref 0.0–0.2)

## 2018-02-16 LAB — BASIC METABOLIC PANEL
Anion gap: 13 (ref 5–15)
BUN: 10 mg/dL (ref 6–20)
CO2: 22 mmol/L (ref 22–32)
Calcium: 9.7 mg/dL (ref 8.9–10.3)
Chloride: 102 mmol/L (ref 98–111)
Creatinine, Ser: 0.98 mg/dL (ref 0.61–1.24)
GFR calc non Af Amer: >60 mL/min (ref >60)
Glucose, Bld: 117 mg/dL — ABNORMAL HIGH (ref 70–99)
Potassium: 4.2 mmol/L (ref 3.5–5.1)
Sodium: 137 mmol/L (ref 135–145)

## 2018-02-16 LAB — D-DIMER, QUANTITATIVE: D-Dimer, Quant: 0.32 ug/mL-FEU (ref 0.00–0.50)

## 2018-02-16 MED ORDER — KETOROLAC TROMETHAMINE 30 MG/ML IJ SOLN
15.0000 mg | Freq: Once | INTRAMUSCULAR | Status: AC
  Administered 2018-02-16: 15 mg via INTRAVENOUS
  Filled 2018-02-16: qty 1

## 2018-02-16 MED ORDER — LIDOCAINE VISCOUS HCL 2 % MT SOLN
15.0000 mL | Freq: Once | OROMUCOSAL | Status: AC
  Administered 2018-02-16: 15 mL via ORAL
  Filled 2018-02-16: qty 15

## 2018-02-16 MED ORDER — ALUM & MAG HYDROXIDE-SIMETH 200-200-20 MG/5ML PO SUSP
30.0000 mL | Freq: Once | ORAL | Status: AC
  Administered 2018-02-16: 30 mL via ORAL
  Filled 2018-02-16: qty 30

## 2018-02-16 NOTE — ED Provider Notes (Signed)
[facility] Provider Note   CSN: [id number] Arrival date & time: 02/16/18  1327     History   Chief Complaint Chief Complaint  Patient presents with  . Chest Pain    HPI Andre Garrett is a 47 y.o. male.  HPI Patient presents with chest pain.  Mid chest.  Feels like tightness.  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  States it is severe.  No relief with EMS nitroglycerin.  Mild shortness of breath.  No cough.  No fevers.  States there is a bump on his left lower leg.  He is a Naval architecttruck driver.  He is a former smoker. Past Medical History:  Diagnosis Date  . Hypertension     Patient Active Problem List   Diagnosis Date Noted  . Lung nodule 08/22/2012  . Chest pain 08/21/2012  . Alcohol abuse 08/21/2012  . Tobacco abuse 08/21/2012  . HTN (hypertension) 08/21/2012  . Allergic rhinitis 08/21/2012    History reviewed. No pertinent surgical history.      Home Medications    Prior to Admission medications   Medication Sig Start Date End Date Taking? Authorizing Provider  loratadine (CLARITIN) 10 MG tablet Take 10 mg by mouth daily.   Yes [provider]  carvedilol (COREG) 12.5 MG tablet Take 1 tablet (12.5 mg total) by mouth 2 (two) times daily with a meal. Patient not taking: Reported on 02/16/2018 08/22/12   Rai, Delene Ruffiniipudeep K, MD  nitroGLYCERIN (NITROSTAT) 0.4 MG SL tablet Place 1 tablet (0.4 mg total) under the tongue every 5 (five) minutes x 3 doses as needed for chest pain. Patient not taking: Reported on 02/16/2018 08/22/12   Cathren Harshai, Ripudeep K, MD    Family History No family history on file.  Social History Social History   Tobacco Use  . Smoking status: Current Every Day Smoker    Types: Cigarettes  Substance Use Topics  . Alcohol use: Yes  . Drug use: Not on file     Allergies   Shellfish allergy   Review of Systems Review of Systems  Constitutional: Negative for appetite change.  HENT:  Negative for congestion.   Respiratory: Positive for shortness of breath. Negative for cough and wheezing.   Cardiovascular: Positive for chest pain and leg swelling.  Gastrointestinal: Negative for abdominal distention.  Genitourinary: Negative for flank pain.  Musculoskeletal: Negative for back pain.  Skin: Negative for rash.  Neurological: Negative for weakness.  Psychiatric/Behavioral: Negative for confusion.     Physical Exam Updated Vital Signs BP (!) 176/95   Pulse 82   Temp 98.2 F (36.8 C) (Oral)   Resp 19   Ht 5\' 7"  (1.702 m)   Wt 80.7 kg   SpO2 96%   BMI 27.88 kg/m   Physical Exam HENT:     Head: Atraumatic.  Neck:     Musculoskeletal: Neck supple.  Cardiovascular:     Rate and Rhythm: Normal rate and regular rhythm.     Heart sounds: Normal heart sounds.  Pulmonary:     Effort: Pulmonary effort is normal.     Comments: No chest tenderness. Abdominal:     Palpations: Abdomen is soft.     Tenderness: There is no abdominal tenderness.  Musculoskeletal:     Right lower leg: No edema.     Comments: Mild tenderness with slight swelling on left anterior lower leg.  Skin:    General: Skin is warm.  Capillary Refill: Capillary refill takes less than 2 seconds.  Neurological:     General: No focal deficit present.     Mental Status: He is alert.      ED Treatments / Results  Labs (all labs ordered are listed, but only abnormal results are displayed) Labs Reviewed  BASIC METABOLIC PANEL - Abnormal; Notable for the following components:      Result Value   Glucose, Bld 117 (*)    All other components within normal limits  CBC  D-DIMER, QUANTITATIVE (NOT AT [facility])  I-STAT TROPONIN, ED  I-STAT TROPONIN, ED    EKG EKG Interpretation  Date/Time:  Tuesday February 16 2018 13:30:40 EST Ventricular Rate:  94 PR Interval:  146 QRS Duration: 86 QT Interval:  332 QTC Calculation: 415 R Axis:   -7 Text Interpretation:  Normal sinus rhythm Possible  Anterior infarct , age undetermined Abnormal ECG Confirmed by Benjiman Core [phone]) on 02/16/2018 3:39:09 PM   Radiology Dg Chest 2 View  Result Date: 02/16/2018 CLINICAL DATA:  Acute onset of mid chest pain and shortness of breath. EXAM: CHEST - 2 VIEW COMPARISON:  08/21/2012, 06/01/2003. FINDINGS: Cardiomediastinal silhouette unremarkable and unchanged. Suboptimal inspiration. Lungs clear. Bronchovascular markings normal. Pulmonary vascularity normal. No visible pleural effusions. No pneumothorax. Visualized bony thorax intact. IMPRESSION: Suboptimal inspiration. No acute cardiopulmonary disease. Electronically Signed   By: Hulan Saas M.D.   On: 02/16/2018 14:11    Procedures Procedures (including critical care time)  Medications Ordered in ED Medications  alum & mag hydroxide-simeth (MAALOX/MYLANTA) 200-200-20 MG/5ML suspension 30 mL (30 mLs Oral Given 02/16/18 1639)    And  lidocaine (XYLOCAINE) 2 % viscous mouth solution 15 mL (15 mLs Oral Given 02/16/18 1639)  ketorolac (TORADOL) 30 MG/ML injection 15 mg (15 mg Intravenous Given 02/16/18 1736)     Initial Impression / Assessment and Plan / ED Course  I have reviewed the triage vital signs and the nursing notes.  Pertinent labs & imaging results that were available during my care of the patient were reviewed by me and considered in my medical decision making (see chart for details).     Patient presents with chest pain.  Mid chest.  Pressure.  EKG reassuring.  Enzymes negative x2.  D-dimer negative.  X-ray reassuring.  No relief with GI cocktail.  Will discharge home to follow-up as an outpatient.  Final Clinical Impressions(s) / ED Diagnoses   Final diagnoses:  Nonspecific chest pain    ED Discharge Orders    None       Benjiman Core, MD 02/16/18 [phone]

## 2018-02-16 NOTE — Discharge Instructions (Addendum)
Follow-up with a primary care doctor peer

## 2018-02-16 NOTE — ED Triage Notes (Signed)
Pt here with ems for central cp that started 45 mins PTA. Pt is truck driver. Describes as "pressure". 324 ASA and 2 nitros given en route without relief of 8/10 pain.

## 2018-02-16 NOTE — ED Notes (Signed)
Pt states he has not taken his amlodipine today

## 2018-02-19 ENCOUNTER — Encounter: Payer: Self-pay | Admitting: Family Medicine

## 2018-02-25 ENCOUNTER — Encounter: Payer: Self-pay | Admitting: Family Medicine

## 2018-02-25 ENCOUNTER — Ambulatory Visit (INDEPENDENT_AMBULATORY_CARE_PROVIDER_SITE_OTHER): Payer: Self-pay | Admitting: Family Medicine

## 2018-02-25 VITALS — BP 155/93 | HR 87 | Resp 17 | Ht 66.0 in | Wt 178.8 lb

## 2018-02-25 DIAGNOSIS — Z1329 Encounter for screening for other suspected endocrine disorder: Secondary | ICD-10-CM

## 2018-02-25 DIAGNOSIS — I10 Essential (primary) hypertension: Secondary | ICD-10-CM

## 2018-02-25 DIAGNOSIS — R911 Solitary pulmonary nodule: Secondary | ICD-10-CM

## 2018-02-25 DIAGNOSIS — Z131 Encounter for screening for diabetes mellitus: Secondary | ICD-10-CM

## 2018-02-25 DIAGNOSIS — Z7689 Persons encountering health services in other specified circumstances: Secondary | ICD-10-CM

## 2018-02-25 DIAGNOSIS — R918 Other nonspecific abnormal finding of lung field: Secondary | ICD-10-CM

## 2018-02-25 DIAGNOSIS — R079 Chest pain, unspecified: Secondary | ICD-10-CM

## 2018-02-25 MED ORDER — AMLODIPINE BESYLATE 10 MG PO TABS: 10.0000 mg | ORAL_TABLET | Freq: Every day | ORAL | 3 refills | Status: DC

## 2018-02-25 NOTE — Progress Notes (Signed)
Andre SheldonBrian Garrett, is a 47 y.o. male  ZOX:[id number]  [id number]  DOB - 12-19-1970  CC:  Chief Complaint  Patient presents with  . Establish Care  . Follow-up    ED 12/17: chest pain. pain has resolved.   . Hypertension    has been out of BP meds x 2 months       HPI: Andre Garrett is a 47 y.o. male is here today to establish care, ER follow-up for chest pain, and hypertension.  Andre ParkinsonBrian C Garrett has history of alcohol abuse; hx of tobacco abuse; HTN (hypertension), right lung nodule, and atypical chest pain.  Today's visit:  Patient is here for an emergency room follow-up after an episode of chest pain. Patient has a history in 2014 of chest pain requiring admission. Work-up during that time and at recent ER visit was negative for any specific cause for chest pain. Coronary calcium study in 2014 negative, although an incidental finding revealed the presence of right lung nodule in which a follow-up study was recommended, although patient never received. Of recent, patient reports most recent chest pain occurred in mid sternal region, last > 1 minutes, and was non-responsive to two nitroglycerin administered by EMS prior to arrival to the ER. He was work-up at the ER and subsequently discharged to follow-up here.  Patient has a history significant for hypertension although reports that he took himself off of his medication approximately 2 months ago and has noticed that blood pressure has started to become elevated.  He engages in no routine exercise.  Has a history of smoking however quit back in 2014.  He has a physically intense job.  Endorses weight gain over the course of the last few years.  Current Body mass index is 28.86 kg/m.  He has not had any further chest pain since the ER visit however is just worried as he has never received an explanation for the recurrent chest pain.  He denies any tests of breath associated with the chest pain.  Denies any trauma or blunt force injury to his chest in the  past.  Unable to associate chest pain with any form of activity.  No recent cardiology evaluation. Recent EKG 02/17/2018: NSR with possible anterior infarct vs artifact, negative for acute ischemic changes    Current medications: Current Outpatient Medications:  .  amLODipine (NORVASC) 10 MG tablet, Take 10 mg by mouth daily., Disp: , Rfl:  .  loratadine (CLARITIN) 10 MG tablet, Take 10 mg by mouth daily., Disp: , Rfl:    Pertinent family medical history: family history includes Diabetes in his brother, father, maternal grandfather, maternal grandmother, mother, paternal grandfather, and paternal grandmother; Hypertension in his brother and father; Skin cancer in his maternal grandmother; Stomach cancer in his maternal uncle.   Allergies  Allergen Reactions  . Shellfish Allergy Nausea And Vomiting    Social History   Socioeconomic History  . Marital status: Married    Spouse name: Not on file  . Number of children: Not on file  . Years of education: Not on file  . Highest education level: Not on file  Occupational History  . Not on file  Social Needs  . Financial resource strain: Not on file  . Food insecurity:    Worry: Not on file    Inability: Not on file  . Transportation needs:    Medical: Not on file    Non-medical: Not on file  Tobacco Use  . Smoking status: Former Smoker  Types: Cigarettes    Last attempt to quit: 2014    Years since quitting: 5.9  . Smokeless tobacco: Never Used  Substance and Sexual Activity  . Alcohol use: Yes  . Drug use: Not on file  . Sexual activity: Not on file  Lifestyle  . Physical activity:    Days per week: Not on file    Minutes per session: Not on file  . Stress: Not on file  Relationships  . Social connections:    Talks on phone: Not on file    Gets together: Not on file    Attends religious service: Not on file    Active member of club or organization: Not on file    Attends meetings of clubs or organizations: Not on  file    Relationship status: Not on file  . Intimate partner violence:    Fear of current or ex partner: Not on file    Emotionally abused: Not on file    Physically abused: Not on file    Forced sexual activity: Not on file  Other Topics Concern  . Not on file  Social History Narrative  . Not on file    Review of Systems: Constitutional: Negative for fever, chills, diaphoresis, activity change, appetite change and fatigue. HENT: Negative for ear pain, nosebleeds, congestion, facial swelling, rhinorrhea, neck pain, neck stiffness and ear discharge.  Eyes: Negative for pain, discharge, redness, itching and visual disturbance. Respiratory: Negative for cough, choking, chest tightness, shortness of breath, wheezing and stridor.  Cardiovascular: Negative for chest pain, palpitations and leg swelling. Gastrointestinal: Negative for abdominal distention. Genitourinary: Negative for dysuria, urgency, frequency, hematuria, flank pain, decreased urine volume, difficulty urinating. Musculoskeletal: Negative for back pain, joint swelling, arthralgia and gait problem. Neurological: Negative for dizziness, tremors, seizures, syncope, facial asymmetry, speech difficulty, weakness, light-headedness, numbness and headaches.  Hematological: Negative for adenopathy. Does not bruise/bleed easily. Psychiatric/Behavioral: Negative for hallucinations, behavioral problems, confusion, dysphoric mood, decreased concentration and agitation.    Objective:   Vitals:   02/25/18 1115  BP: (!) 155/93  Pulse: 87  Resp: 17  SpO2: 95%    BP Readings from Last 3 Encounters:  02/25/18 (!) 155/93  02/16/18 (!) 176/95  08/22/12 130/90    Filed Weights   02/25/18 1115  Weight: 178 lb 12.8 oz (81.1 kg)      Physical Exam: Constitutional: Patient appears well-developed and well-nourished. No distress. HENT: Normocephalic, atraumatic, External right and left ear normal. Oropharynx is clear and moist.  Eyes:  Conjunctivae and EOM are normal. PERRLA, no scleral icterus. Neck: Normal ROM. Neck supple. No JVD. No tracheal deviation. No thyromegaly. CVS: RRR, S1/S2 +, no murmurs, no gallops, no carotid bruit.  Pulmonary: Effort and breath sounds normal, no stridor, rhonchi, wheezes, rales.  Abdominal: Soft. BS +, no distension, tenderness, rebound or guarding.  Musculoskeletal: Normal range of motion. No edema and no tenderness.  Neuro: Alert. Normal muscle tone coordination. Normal gait. BUE and BLE strength 5/5. Skin: Skin is warm and dry. No rash noted. Not diaphoretic. No erythema. No pallor. Psychiatric: Normal mood and affect. Behavior, judgment, thought content normal.  Lab Results (prior encounters)  Lab Results  Component Value Date   WBC 6.2 02/16/2018   HGB 15.8 02/16/2018   HCT 47.8 02/16/2018   MCV 93.4 02/16/2018   PLT 226 02/16/2018   Lab Results  Component Value Date   CREATININE 0.98 02/16/2018   BUN 10 02/16/2018   NA 137 02/16/2018   K  4.2 02/16/2018   CL 102 02/16/2018   CO2 22 02/16/2018    Lab Results  Component Value Date   HGBA1C 5.4 08/21/2012       Component Value Date/Time   CHOL 143 08/22/2012 0545   TRIG 89 08/22/2012 0545   HDL 78 08/22/2012 0545   CHOLHDL 1.8 08/22/2012 0545   VLDL 18 08/22/2012 0545   LDLCALC 47 08/22/2012 0545        Assessment and plan:  1. Encounter to establish care 2. Thyroid disorder screen - TSH  3. Diabetes mellitus screening - Hemoglobin A1c - Comprehensive metabolic panel  4. Chest pain, unspecified type, referring patient to cardiology for further work-up and evaluation - Ambulatory referral to Cardiology  5. Essential hypertension Resume amlodipine 10 mg We have discussed target BP range and blood pressure goal. I have advised patient to check BP regularly and to call us back or report to clinic if the numbers are consistently higher than 140/90. We discussed the importance of compliance with medical therapy  and DASH diet recommended, consequences of uncontrolled hypertension discussed.   6. Nodule of right lung -Obtaining a low dose CT follow-up lung nodule study  7. Abnormal findings on diagnostic imaging of lung - CT CHEST LUNG CA SCREEN LOW DOSE W/O CM; Future   Orders Placed This Encounter  Procedures  . CT CHEST LUNG CA SCREEN LOW DOSE W/O CM  . TSH  . Hemoglobin A1c  . Comprehensive metabolic panel  . Ambulatory referral to Cardiology    Meds ordered this encounter  Medications  . amLODipine (NORVASC) 10 MG tablet    Sig: Take 1 tablet (10 mg total) by mouth daily.    Dispense:  90 tablet    Refill:  3    Return in about 3 months (around 05/27/2018) for Hypertension follow-up .   The patient was given clear instructions to go to ER or return to medical center if symptoms don't improve, worsen or new problems develop. The patient verbalized understanding. The patient was advised  to call and obtain lab results if they haven't heard anything from out office within 7-10 business days.  Joaquin Courts, FNP [facility] [address], [place] [address] [phone]: [phone]    This note has been created with Dragon speech recognition software and Paediatric nurse. Any transcriptional errors are unintentional.

## 2018-02-25 NOTE — Patient Instructions (Addendum)
[facility]    Andre Garrett was seen by Joaquin CourtsKimberly Harris, FNP today.   Andre Garrett's primary care provider is Bing NeighborsHarris, Kimberly S, FNP.   For the best care possible, you should try to see Joaquin CourtsKimberly Harris, FNP-C whenever you come to the clinic.   We look forward to seeing you again soon!  If you have any questions about your visit today, please call us at [phone] or feel free to reach your primary care provider via MyChart.        Nonspecific Chest Pain Chest pain can be caused by many different conditions. Some causes of chest pain can be life-threatening. These will require treatment right away. Serious causes of chest pain include:  Heart attack.  A tear in the body's main blood vessel.  Redness and swelling (inflammation) around your heart.  Blood clot in your lungs. Other causes of chest pain may not be so serious. These include:  Heartburn.  Anxiety or stress.  Damage to bones or muscles in your chest.  Lung infections. Chest pain can feel like:  Pain or discomfort in your chest.  Crushing, pressure, aching, or squeezing pain.  Burning or tingling.  Dull or sharp pain that is worse when you move, cough, or take a deep breath.  Pain or discomfort that is also felt in your back, neck, jaw, shoulder, or arm, or pain that spreads to any of these areas. It is hard to know whether your pain is caused by something that is serious or something that is not so serious. So it is important to see your doctor right away if you have chest pain. Follow these instructions at home: Medicines  Take over-the-counter and prescription medicines only as told by your doctor.  If you were prescribed an antibiotic medicine, take it as told by your doctor. Do not stop taking the antibiotic even if you start to feel better. Lifestyle   Rest as told by your doctor.  Do not use any products that contain nicotine  or tobacco, such as cigarettes, e-cigarettes, and chewing tobacco. If you need help quitting, ask your doctor.  Do not drink alcohol.  Make lifestyle changes as told by your doctor. These may include: ? Getting regular exercise. Ask your doctor what activities are safe for you. ? Eating a heart-healthy diet. A diet and nutrition specialist (dietitian) can help you to learn healthy eating options. ? Staying at a healthy weight. ? Treating diabetes or high blood pressure, if needed. ? Lowering your stress. Activities such as yoga and relaxation techniques can help. General instructions  Pay attention to any changes in your symptoms. Tell your doctor about them or any new symptoms.  Avoid any activities that cause chest pain.  Keep all follow-up visits as told by your doctor. This is important. You may need more testing if your chest pain does not go away. Contact a doctor if:  Your chest pain does not go away.  You feel depressed.  You have a fever. Get help right away if:  Your chest pain is worse.  You have a cough that gets worse, or you cough up blood.  You have very bad (severe) pain in your belly (abdomen).  You pass out (faint).  You have either of these for no clear reason: ? Sudden chest discomfort. ? Sudden discomfort in your arms, back, neck, or jaw.  You have shortness of breath at any  time.  You suddenly start to sweat, or your skin gets clammy.  You feel sick to your stomach (nauseous).  You throw up (vomit).  You suddenly feel lightheaded or dizzy.  You feel very weak or tired.  Your heart starts to beat fast, or it feels like it is skipping beats. These symptoms may be an emergency. Do not wait to see if the symptoms will go away. Get medical help right away. Call your local emergency services (911 in the [place] Do not drive yourself to the hospital. Summary  Chest pain can be caused by many different conditions. The cause may be serious and need  treatment right away. If you have chest pain, see your doctor right away.  Follow your doctor's instructions for taking medicines and making lifestyle changes.  Keep all follow-up visits as told by your doctor. This includes visits for any further testing if your chest pain does not go away.  Be sure to know the signs that show that your condition has become worse. Get help right away if you have these symptoms. This information is not intended to replace advice given to you by your health care provider. Make sure you discuss any questions you have with your health care provider. Document Released: 08/06/2007 Document Revised: 08/20/2017 Document Reviewed: 08/20/2017 Elsevier Interactive Patient Education  2019 [employer]

## 2018-02-26 ENCOUNTER — Telehealth: Payer: Self-pay | Admitting: Family Medicine

## 2018-02-26 DIAGNOSIS — E875 Hyperkalemia: Secondary | ICD-10-CM

## 2018-02-26 LAB — COMPREHENSIVE METABOLIC PANEL
ALT: 20 IU/L (ref 0–44)
AST: 22 IU/L (ref 0–40)
Albumin/Globulin Ratio: 1.8 (ref 1.2–2.2)
Albumin: 4.7 g/dL (ref 3.5–5.5)
Alkaline Phosphatase: 90 IU/L (ref 39–117)
BUN/Creatinine Ratio: 18 (ref 9–20)
BUN: 20 mg/dL (ref 6–24)
Bilirubin Total: 0.6 mg/dL (ref 0.0–1.2)
CO2: 19 mmol/L — ABNORMAL LOW (ref 20–29)
Calcium: 10.5 mg/dL — ABNORMAL HIGH (ref 8.7–10.2)
Chloride: 104 mmol/L (ref 96–106)
Creatinine, Ser: 1.12 mg/dL (ref 0.76–1.27)
GFR calc Af Amer: 89 mL/min/{1.73_m2} (ref >59)
GFR calc non Af Amer: 77 mL/min/{1.73_m2} (ref >59)
GLUCOSE: 104 mg/dL — AB (ref 65–99)
Globulin, Total: 2.6 g/dL (ref 1.5–4.5)
Potassium: 5.6 mmol/L — ABNORMAL HIGH (ref 3.5–5.2)
Sodium: 143 mmol/L (ref 134–144)
Total Protein: 7.3 g/dL (ref 6.0–8.5)

## 2018-02-26 LAB — HEMOGLOBIN A1C
Est. average glucose Bld gHb Est-mCnc: 114 mg/dL
Hgb A1c MFr Bld: 5.6 % (ref 4.8–5.6)

## 2018-02-26 LAB — TSH: TSH: 2.29 u[IU]/mL (ref 0.450–4.500)

## 2018-02-26 MED ORDER — FUROSEMIDE 20 MG PO TABS: 20.0000 mg | ORAL_TABLET | Freq: Every day | ORAL | 0 refills | Status: DC

## 2018-02-26 NOTE — Telephone Encounter (Signed)
Ct scheduled for 03/08/18 @ 3:30 PM. No special prep instructions.

## 2018-02-26 NOTE — Telephone Encounter (Signed)
Patient notified of lab results & recommendations. Expressed understanding. Made a lab appointment for 4:00 on 03/05/18 to get his potassium rechecked.

## 2018-02-26 NOTE — Addendum Note (Signed)
Addended by: Heidi DachWALKER, Barbee Mamula M on: 02/26/2018 03:15 PM   Modules accepted: Orders

## 2018-02-26 NOTE — Telephone Encounter (Signed)
Contact patient to advise of following: Potassium slightly elevated 5.6 -sending over Lasix 20 mg once daily x 3 days to reduce potassium level. Diabetes screening normal, thyroid normal, liver and kidney function normal. Try and have him come in on 1/6 for a repeat potassium check. If he needs to schedule out a day or so that's fine.

## 2018-02-26 NOTE — Telephone Encounter (Signed)
Please schedule low dose CT. Order in system, [facility]

## 2018-03-05 ENCOUNTER — Ambulatory Visit: Payer: Self-pay | Admitting: Family Medicine

## 2018-03-05 DIAGNOSIS — E875 Hyperkalemia: Secondary | ICD-10-CM

## 2018-03-05 DIAGNOSIS — Z719 Counseling, unspecified: Secondary | ICD-10-CM

## 2018-03-06 LAB — POTASSIUM: POTASSIUM: 4.9 mmol/L (ref 3.5–5.2)

## 2018-03-08 ENCOUNTER — Other Ambulatory Visit: Payer: Self-pay | Admitting: Family Medicine

## 2018-03-08 ENCOUNTER — Ambulatory Visit (HOSPITAL_COMMUNITY)
Admission: RE | Admit: 2018-03-08 | Discharge: 2018-03-08 | Disposition: A | Discharge status: Home / Self Care | Payer: Self-pay | Source: Ambulatory Visit | Attending: Family Medicine | Admitting: Family Medicine

## 2018-03-08 DIAGNOSIS — R918 Other nonspecific abnormal finding of lung field: Secondary | ICD-10-CM

## 2018-03-09 NOTE — Progress Notes (Signed)
Normal lab letter mailed.

## 2018-03-10 ENCOUNTER — Telehealth: Payer: Self-pay | Admitting: Family Medicine

## 2018-03-10 NOTE — Telephone Encounter (Signed)
Signed         Show:Clear all [x] Manual[] Template[] Copied  Added by: [x] Bing Neighbors, FNP  [] Hover for details Notify patient that recent CT of chest indicates the presence of benign non-suspicious lung nodules on the right lung. No additional imaging is warranted. Nodules have not grown or changes since imaging 2014 therefore no additional follow-up is warranted.

## 2018-03-10 NOTE — Telephone Encounter (Signed)
Left voice mail to call back 

## 2018-03-10 NOTE — Evaluation (Signed)
Notify patient that recent CT of chest indicates the presence of benign non-suspicious lung nodules on the right lung. No additional imaging is warranted. Nodules have not grown or changes since imaging 2014 therefore no additional follow-up is warranted.

## 2018-03-10 NOTE — Telephone Encounter (Signed)
Patient notified of lab results & CT results. Expressed understanding.

## 2018-04-21 ENCOUNTER — Ambulatory Visit (INDEPENDENT_AMBULATORY_CARE_PROVIDER_SITE_OTHER): Payer: Self-pay | Admitting: Cardiology

## 2018-04-21 ENCOUNTER — Encounter: Payer: Self-pay | Admitting: Cardiology

## 2018-04-21 VITALS — BP 168/110 | HR 101 | Ht 66.0 in | Wt 184.4 lb

## 2018-04-21 DIAGNOSIS — R079 Chest pain, unspecified: Secondary | ICD-10-CM

## 2018-04-21 DIAGNOSIS — Z87891 Personal history of nicotine dependence: Secondary | ICD-10-CM

## 2018-04-21 DIAGNOSIS — I10 Essential (primary) hypertension: Secondary | ICD-10-CM

## 2018-04-21 NOTE — Progress Notes (Signed)
Cardiology Office Note:    Date:  04/21/2018   ID:  ZADKIEL RASMUSSEN, DOB 07-Apr-1970, MRN [id number]  PCP:  Bing Neighbors, FNP  Cardiologist:  Donato Schultz, MD  Electrophysiologist:  None   Referring MD: Bing Neighbors, FNP     History of Present Illness:    Andre Garrett is a 48 y.o. male here for the evaluation of chest pain at the request of Joaquin Courts, NP.  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  Minimal shortness of breath.  Former smoker.  Also has been diagnosed with hypertension.  Troponins were normal, d-dimer was normal discharged home.  X-ray was reassuring. 5 years ago.   Father family with CAD.  No tob quit 5 year.   L4-5 back out. Stressed BP up. Amlodipine worked.   He has not had any further episodes of this chest discomfort.  This happened identically about 5 years ago.  Intense pain.  Past Medical History:  Diagnosis Date  . Hypertension     No past surgical history on file.  Current Medications: Current Meds  Medication Sig  . amLODipine (NORVASC) 10 MG tablet Take 1 tablet (10 mg total) by mouth daily.  Marland Kitchen loratadine (CLARITIN) 10 MG tablet Take 10 mg by mouth daily.     Allergies:   Shellfish allergy   Social History   Socioeconomic History  . Marital status: Significant Other    Spouse name: Not on file  . Number of children: Not on file  . Years of education: Not on file  . Highest education level: Not on file  Occupational History  . Not on file  Social Needs  . Financial resource strain: Not on file  . Food insecurity:    Worry: Not on file    Inability: Not on file  . Transportation needs:    Medical: Not on file    Non-medical: Not on file  Tobacco Use  . Smoking status: Former Smoker    Types: Cigarettes    Last attempt to quit: 2014    Years since quitting: 6.1  . Smokeless tobacco: Never Used  Substance and Sexual  Activity  . Alcohol use: Yes  . Drug use: Not on file  . Sexual activity: Not on file  Lifestyle  . Physical activity:    Days per week: Not on file    Minutes per session: Not on file  . Stress: Not on file  Relationships  . Social connections:    Talks on phone: Not on file    Gets together: Not on file    Attends religious service: Not on file    Active member of club or organization: Not on file    Attends meetings of clubs or organizations: Not on file    Relationship status: Not on file  Other Topics Concern  . Not on file  Social History Narrative  . Not on file     Family History: The patient's family history includes Diabetes in his brother, father, maternal grandfather, maternal grandmother, mother, paternal grandfather, and paternal grandmother; Hypertension in his brother and father; Skin cancer in his maternal grandmother; Stomach cancer in his maternal uncle.  ROS:   Please see the history of present illness.    Positive for back and neck pain, sometimes paresthesias in his hands.  Overall no fevers chills nausea vomiting syncope bleeding.  All other systems  reviewed and are negative.  EKGs/Labs/Other Studies Reviewed:    The following studies were reviewed today: CT scan of chest personally reviewed shows no evidence of coronary artery calcification, pericardial fluid or aortic atherosclerosis.  Reassuring.  Aorta dimensions appear normal.  D-dimer normal.  EKG: 02/16/2018-sinus rhythm with no other specific abnormalities.  Recent Labs: 02/16/2018: Hemoglobin 15.8; Platelets 226 02/25/2018: ALT 20; BUN 20; Creatinine, Ser 1.12; Sodium 143; TSH 2.290 03/05/2018: Potassium 4.9  Recent Lipid Panel    Component Value Date/Time   CHOL 143 08/22/2012 0545   TRIG 89 08/22/2012 0545   HDL 78 08/22/2012 0545   CHOLHDL 1.8 08/22/2012 0545   VLDL 18 08/22/2012 0545   LDLCALC 47 08/22/2012 0545    Physical Exam:    VS:  BP (!) 168/110   Pulse (!) 101   Ht 5'  6" (1.676 m)   Wt 184 lb 6.4 oz (83.6 kg)   SpO2 95%   BMI 29.76 kg/m     Wt Readings from Last 3 Encounters:  04/21/18 184 lb 6.4 oz (83.6 kg)  02/25/18 178 lb 12.8 oz (81.1 kg)  02/16/18 178 lb (80.7 kg)     GEN:  Well nourished, well developed in no acute distress HEENT: Normal NECK: No JVD; No carotid bruits LYMPHATICS: No lymphadenopathy CARDIAC: RRR, no murmurs, rubs, gallops RESPIRATORY:  Clear to auscultation without rales, wheezing or rhonchi  ABDOMEN: Soft, non-tender, non-distended MUSCULOSKELETAL:  No edema; No deformity  SKIN: Warm and dry NEUROLOGIC:  Alert and oriented x 3 PSYCHIATRIC:  Normal affect   ASSESSMENT:    1. Chest pain, unspecified type   2. Essential hypertension   3. Former smoker    PLAN:    In order of problems listed above:  Chest discomfort -Reassuring lab work, CT scan, EKG, troponin.  We will go ahead and proceed with exercise treadmill test.  Former smoker.  Could be musculoskeletal, could be esophageal spasm.  Essential hypertension - Blood pressure has been under reasonable control with amlodipine.  Today increased stress, higher reading.  Former smoker - Congratulated him on cessation.  Excellent.   Medication Adjustments/Labs and Tests Ordered: Current medicines are reviewed at length with the patient today.  Concerns regarding medicines are outlined above.  Orders Placed This Encounter  Procedures  . EXERCISE TOLERANCE TEST (ETT)   No orders of the defined types were placed in this encounter.   Patient Instructions  Medication Instructions:  The current medical regimen is effective;  continue present plan and medications.  If you need a refill on your cardiac medications before your next appointment, please call your pharmacy.   Testing/Procedures: Your physician has requested that you have an exercise tolerance test. For further information please visit [email or web address] Please also follow instruction sheet, as  given.  Follow-Up: Follow up will be determined above on the results of the above testing.  Thank you for choosing [facility]!!        Signed, Donato Schultz, MD  04/21/2018 2:37 PM    [facility]

## 2018-04-21 NOTE — Patient Instructions (Signed)
Medication Instructions:  The current medical regimen is effective;  continue present plan and medications.  If you need a refill on your cardiac medications before your next appointment, please call your pharmacy.   Testing/Procedures: Your physician has requested that you have an exercise tolerance test. For further information please visit [email or web address] Please also follow instruction sheet, as given.  Follow-Up: Follow up will be determined above on the results of the above testing.  Thank you for choosing [facility]!!

## 2018-05-27 ENCOUNTER — Ambulatory Visit: Payer: Self-pay | Admitting: Family Medicine

## 2018-12-13 ENCOUNTER — Other Ambulatory Visit: Payer: Self-pay

## 2018-12-13 ENCOUNTER — Encounter: Payer: Self-pay | Admitting: Family Medicine

## 2018-12-13 ENCOUNTER — Ambulatory Visit (INDEPENDENT_AMBULATORY_CARE_PROVIDER_SITE_OTHER): Payer: Self-pay | Admitting: Family Medicine

## 2018-12-13 VITALS — BP 149/89 | HR 81 | Temp 97.5°F | Resp 17 | Wt 173.4 lb

## 2018-12-13 DIAGNOSIS — R82998 Other abnormal findings in urine: Secondary | ICD-10-CM

## 2018-12-13 DIAGNOSIS — Z23 Encounter for immunization: Secondary | ICD-10-CM

## 2018-12-13 DIAGNOSIS — R1011 Right upper quadrant pain: Secondary | ICD-10-CM

## 2018-12-13 DIAGNOSIS — R350 Frequency of micturition: Secondary | ICD-10-CM

## 2018-12-13 DIAGNOSIS — K529 Noninfective gastroenteritis and colitis, unspecified: Secondary | ICD-10-CM

## 2018-12-13 LAB — POCT URINALYSIS DIP (CLINITEK)
Bilirubin, UA: NEGATIVE
Blood, UA: NEGATIVE
Glucose, UA: NEGATIVE mg/dL
Ketones, POC UA: NEGATIVE mg/dL
Leukocytes, UA: NEGATIVE
Nitrite, UA: NEGATIVE
POC PROTEIN,UA: NEGATIVE
Spec Grav, UA: 1.025
Urobilinogen, UA: 0.2 U/dL
pH, UA: 5

## 2018-12-13 NOTE — Progress Notes (Signed)
Has c/o bloating, abdominal pain, loss of appetite, diarrhea & constipation x a few months.

## 2018-12-13 NOTE — Progress Notes (Signed)
Here for labs ordered during televisit.

## 2018-12-13 NOTE — Progress Notes (Signed)
Established Patient Office Visit  Subjective:  Patient ID: Andre Garrett, male    DOB: 08/22/1970  Age: 48 y.o. MRN: [id number]  CC:  Chief Complaint  Patient presents with  . Weight Loss  . Bloated  . Anorexia    HPI Andre Garrett presents due to complaint of issues with right upper quadrant pain for the past 2 to 3 weeks and patient has had chronic diarrhea that has been longstanding.  He also reports recent increase in fatigue, decreased appetite and believes that he has had weight loss due to diarrhea and decreased appetite.  Patient reports that about 2 weeks ago he had onset of pain in his right upper abdomen that was about a 8-10 on a 0-to-10 scale and he feels as if his liver was swollen as he did feel a palpable fullness in the right upper to mid abdomen.  Patient reports past medical history of alcohol use and states that he is afraid that his past behavior has caught up with him.  He denies any fever or chills, no night sweats.  No cough or shortness of breath, no chest pain or palpitations.  He states that sometimes the diarrhea is light in color.  He denies any blood in the diarrhea, no black fecal material and no blood with wiping after bowel movement.  Currently he has some mild discomfort in his right upper abdomen which is around 3-4 on a 0-to-10 scale.  He does not feel as if eating causes any change in his symptoms-no worsening or improvement in abdominal pain after a meal.  Patient states that his right upper quadrant pain was intermittent then became constant and was initially throbbing in nature.  Patient feels that his right upper quadrant pain improved after he increased his water intake and stop drinking soda.  Patient reports that his urine has also been very dark in color.  He has had some urinary frequency but no dysuria.  Past Medical History:  Diagnosis Date  . Hypertension     Surgical History:  -no past surgeries  Family History  Problem Relation Age of  Onset  . Diabetes Mother   . Diabetes Father   . Hypertension Father   . Diabetes Brother   . Hypertension Brother   . Diabetes Maternal Grandmother   . Skin cancer Maternal Grandmother   . Diabetes Maternal Grandfather   . Diabetes Paternal Grandmother   . Diabetes Paternal Grandfather   . Stomach cancer Maternal Uncle     Social History   Socioeconomic History  . Marital status: Significant Other    Spouse name: Not on file  . Number of children: Not on file  . Years of education: Not on file  . Highest education level: Not on file  Occupational History  . Not on file  Social Needs  . Financial resource strain: Not on file  . Food insecurity    Worry: Not on file    Inability: Not on file  . Transportation needs    Medical: Not on file    Non-medical: Not on file  Tobacco Use  . Smoking status: Former Smoker    Types: Cigarettes    Quit date: 2014    Years since quitting: 6.7  . Smokeless tobacco: Never Used  Substance and Sexual Activity  . Alcohol use: Yes  . Drug use: Not on file  . Sexual activity: Not on file  Lifestyle  . Physical activity    Days per week:  Not on file    Minutes per session: Not on file  . Stress: Not on file  Relationships  . Social Musicianconnections    Talks on phone: Not on file    Gets together: Not on file    Attends religious service: Not on file    Active member of club or organization: Not on file    Attends meetings of clubs or organizations: Not on file    Relationship status: Not on file  . Intimate partner violence    Fear of current or ex partner: Not on file    Emotionally abused: Not on file    Physically abused: Not on file    Forced sexual activity: Not on file  Other Topics Concern  . Not on file  Social History Narrative  . Not on file    Outpatient Medications Prior to Visit  Medication Sig Dispense Refill  . amLODipine (NORVASC) 10 MG tablet Take 1 tablet (10 mg total) by mouth daily. 90 tablet 3  .  loratadine (CLARITIN) 10 MG tablet Take 10 mg by mouth daily.     No facility-administered medications prior to visit.     Allergies  Allergen Reactions  . Shellfish Allergy Nausea And Vomiting    ROS Review of Systems  Constitutional: Positive for fatigue. Negative for chills and fever.  Eyes: Negative for photophobia and visual disturbance.  Respiratory: Negative for cough and shortness of breath.   Cardiovascular: Negative for chest pain, palpitations and leg swelling.  Gastrointestinal: Positive for abdominal pain and diarrhea. Negative for blood in stool, constipation and nausea.  Endocrine: Positive for polyuria. Negative for polydipsia and polyphagia.  Genitourinary: Positive for frequency. Negative for dysuria.  Musculoskeletal: Positive for arthralgias, back pain, neck pain and neck stiffness.  Neurological: Negative for dizziness and headaches.  Hematological: Negative for adenopathy. Does not bruise/bleed easily.      Objective:    Physical Exam  Constitutional: He is oriented to person, place, and time. He appears well-developed and well-nourished.  Neck: Normal range of motion. Neck supple. No JVD present. No thyromegaly present.  Cardiovascular: Normal rate and regular rhythm.  Pulmonary/Chest: Effort normal and breath sounds normal.  Abdominal: Soft. Bowel sounds are normal. There is abdominal tenderness (minimal tenderness with deep palpation from beneath/underneath the  liver). There is no rebound and no guarding.  Musculoskeletal:        General: Tenderness (lumbar paraspinous spasm and mild lumbosacral discomfort with palpation) present. No edema.     Comments: No CVA tenderness  Lymphadenopathy:    He has no cervical adenopathy.  Neurological: He is alert and oriented to person, place, and time. No cranial nerve deficit.  Skin: Skin is warm and dry.  Psychiatric: He has a normal mood and affect. His behavior is normal.  Nursing note and vitals  reviewed.   BP (!) 149/89   Pulse 81   Temp (!) 97.5 F (36.4 C) (Temporal)   Resp 17   Wt 173 lb 6.4 oz (78.7 kg)   SpO2 96%   BMI 27.99 kg/m  Wt Readings from Last 3 Encounters:  12/13/18 173 lb 6.4 oz (78.7 kg)  04/21/18 184 lb 6.4 oz (83.6 kg)  02/25/18 178 lb 12.8 oz (81.1 kg)     Health Maintenance Due  Topic Date Due  . HIV Screening  02/13/1985  . TETANUS/TDAP  02/13/1989  . INFLUENZA VACCINE  10/02/2018   -Patient was offered and received influenza immunization at today's visit  Lab Results  Component Value Date   TSH 2.290 02/25/2018   Lab Results  Component Value Date   WBC 6.2 02/16/2018   HGB 15.8 02/16/2018   HCT 47.8 02/16/2018   MCV 93.4 02/16/2018   PLT 226 02/16/2018   Lab Results  Component Value Date   NA 143 02/25/2018   K 4.9 03/05/2018   CO2 19 (L) 02/25/2018   GLUCOSE 104 (H) 02/25/2018   BUN 20 02/25/2018   CREATININE 1.12 02/25/2018   BILITOT 0.6 02/25/2018   ALKPHOS 90 02/25/2018   AST 22 02/25/2018   ALT 20 02/25/2018   PROT 7.3 02/25/2018   ALBUMIN 4.7 02/25/2018   CALCIUM 10.5 (H) 02/25/2018   ANIONGAP 13 02/16/2018   Lab Results  Component Value Date   CHOL 143 08/22/2012   Lab Results  Component Value Date   HDL 78 08/22/2012   Lab Results  Component Value Date   LDLCALC 47 08/22/2012   Lab Results  Component Value Date   TRIG 89 08/22/2012   Lab Results  Component Value Date   CHOLHDL 1.8 08/22/2012   Lab Results  Component Value Date   HGBA1C 5.6 02/25/2018      Assessment & Plan:  1. Abdominal pain, right upper quadrant Patient reports that he recently had episode within the past 2 weeks of moderate to severe right upper quadrant pain along with sensation of swelling in his right upper quadrant and patient with issues with diarrhea and c/o weight loss, loss of appetite. He reports that he drank alcohol in the past and is afraid that 'this has caught up with him". Patient will be scheduled for RUQ  ultrasound as well as labs today including CMP, CBC and hepatitis panel. He will also be referred to GI for further evaluation and treatment.  - Ambulatory referral to Gastroenterology - Comprehensive metabolic panel; Future - CBC with Differential - Acute Hep Panel & Hep B Surface Ab - US Abdomen Limited RUQ; Future - Comprehensive metabolic panel  2. Dark urine Patient with the complaint of dark urine which he believes may signal issues with liver dysfunction. Patient with UA done today which was normal. He is being referred to GI for further evaluation and treatment of dark urine and right upper quadrant pain.  - Ambulatory referral to Gastroenterology - POCT URINALYSIS DIP (CLINITEK)  3. Chronic diarrhea Patient with complaint of chronic issues with diarrhea and he will be referred to GI for further evaluation and treatment. He will also have CMP today to look for electrolyte disorder due to his diarrhea as well as to check his liver enzymes due to recent right upper quadrant pain. Handout on diet to help with diarrhea given as part of AVS-after visit summary. - Ambulatory referral to Gastroenterology - Comprehensive metabolic panel  4. Urinary Frequency Patient with complaint of urinary frequency and his UA was normal. Patient will have Hgb A1c due to FHx of diabetes and prior Hgb A1c of 5.6.  -POCT UA -Hgb A1c  5. Need for immunization against influenza Patient was offered and agreed to have influenza immunization at today's visit.  He also received education handout regarding immunization.   An After Visit Summary was printed and given to the patient.  Follow-up: Return in about 4 weeks (around 01/10/2019) for abdominal pain: 4-6 weeks if not better.    Antony Blackbird, MD

## 2018-12-13 NOTE — Patient Instructions (Signed)
Chronic Diarrhea Diarrhea is a condition in which a person passes frequent loose and watery stools. It can cause you to feel weak and dehydrated. Dehydration can make you tired and thirsty. It can also cause a dry mouth, decreased urination, and dark yellow urine. Diarrhea is a sign of another underlying problem, such as:  Infection.  Medication side effects.  Dietary intolerance, such as lactose intolerance.  Conditions such as celiac disease, irritable bowel syndrome (IBS), or inflammatory bowel disease (IBD). In most cases, diarrhea lasts 2-3 days. Diarrhea that lasts longer than 4 weeks is called long-lasting (chronic) diarrhea. It is important that you treat your diarrhea as told by your health care provider. Follow these instructions at home: Follow these recommendations as told by your health care provider. Eating and drinking  Take an oral rehydration solution (ORS). This is a drink that is designed to keep you hydrated. It can be found at pharmacies and retail stores.  Drink clear fluids, such as water, ice chips, diluted fruit juice, and low-calorie sports drinks.  Follow the diet recommended by your health care provider. You may need to avoid foods that trigger diarrhea for you.  Avoid foods and beverages that contain a lot of sugar or caffeine.  Avoid alcohol.  Avoid spicy or fatty foods. General instructions      Drink enough fluid to keep your urine clear or pale yellow.  Wash your hands often and after each diarrhea episode. If soap and water are not available, use hand sanitizer.  Make sure that all people in your household wash their hands well and often.  Take over-the-counter and prescription medicines only as told by your health care provider.  If you were prescribed an antibiotic medicine, take it as told by your health care provider. Do not stop taking the antibiotic even if you start to feel better.  Rest at home while you recover.  Watch your  condition for any changes.  Take a warm bath to relieve any burning or pain from frequent diarrhea episodes.  Keep all follow-up visits as told by your health care provider. This is important. Contact a health care provider if:  You have a fever.  Your diarrhea gets worse or does not get better.  You have new symptoms.  You cannot drink fluids without vomiting.  You feel light-headed or dizzy.  You have a headache.  You have muscle cramps.  You have severe pain in the rectum. Get help right away if:  You have persistent vomiting.  You have chest pain.  You feel extremely weak or you faint.  You have bloody or black stools, or stools that look like tar.  You have severe pain, cramping, or bloating in your abdomen, or pain that stays in one place.  You have trouble breathing or you are breathing very quickly.  Your heart is beating very quickly.  Your skin feels cold and clammy.  You feel confused.  You have a severe headache.  You have signs of dehydration, such as: ? Dark urine, very little urine, or no urine. ? Cracked lips. ? Dry mouth. ? Sunken eyes. ? Sleepiness. ? Weakness. Summary  Chronic diarrhea is a condition in which a person passes frequent loose and watery stools for more than 4 weeks.  Diarrhea is a sign of another underlying problem.  Drink enough fluid to keep your urine clear or pale yellow to avoid dehydration.  Wash your hands often and after each diarrhea episode. If soap and water are   not available, use hand sanitizer.  It is important that you treat your diarrhea as told by your health care provider. This information is not intended to replace advice given to you by your health care provider. Make sure you discuss any questions you have with your health care provider. Document Released: 05/10/2003 Document Revised: 05/15/2017 Document Reviewed: 01/07/2016 Elsevier Patient Education  2020 [employer] to Help  Relieve Diarrhea, Adult When you have diarrhea, the foods you eat and your eating habits are very important. Choosing the right foods and drinks can help:  Relieve diarrhea.  Replace lost fluids and nutrients.  Prevent dehydration. What general guidelines should I follow?  Relieving diarrhea  Choose foods with less than 2 g or .07 oz. of fiber per serving.  Limit fats to less than 8 tsp (38 g or 1.34 oz.) a day.  Avoid the following: ? Foods and beverages sweetened with high-fructose corn syrup, honey, or sugar alcohols such as xylitol, sorbitol, and mannitol. ? Foods that contain a lot of fat or sugar. ? Fried, greasy, or spicy foods. ? High-fiber grains, breads, and cereals. ? Raw fruits and vegetables.  Eat foods that are rich in probiotics. These foods include dairy products such as yogurt and fermented milk products. They help increase healthy bacteria in the stomach and intestines (gastrointestinal tract, or GI tract).  If you have lactose intolerance, avoid dairy products. These may make your diarrhea worse.  Take medicine to help stop diarrhea (antidiarrheal medicine) only as told by your health care provider. Replacing nutrients  Eat small meals or snacks every 3-4 hours.  Eat bland foods, such as white rice, toast, or baked potato, until your diarrhea starts to get better. Gradually reintroduce nutrient-rich foods as tolerated or as told by your health care provider. This includes: ? Well-cooked protein foods. ? Peeled, seeded, and soft-cooked fruits and vegetables. ? Low-fat dairy products.  Take vitamin and mineral supplements as told by your health care provider. Preventing dehydration  Start by sipping water or a special solution to prevent dehydration (oral rehydration solution, ORS). Urine that is clear or pale yellow means that you are getting enough fluid.  Try to drink at least 8-10 cups of fluid each day to help replace lost fluids.  You may add other  liquids in addition to water, such as clear juice or decaffeinated sports drinks, as tolerated or as told by your health care provider.  Avoid drinks with caffeine, such as coffee, tea, or soft drinks.  Avoid alcohol. What foods are recommended?     The items listed may not be a complete list. Talk with your health care provider about what dietary choices are best for you. Grains White rice. White, [place], or pita breads (fresh or toasted), including plain rolls, buns, or bagels. White pasta. Saltine, soda, or graham crackers. Pretzels. Low-fiber cereal. Cooked cereals made with water (such as cornmeal, farina, or cream cereals). Plain muffins. Matzo. Melba toast. Zwieback. Vegetables Potatoes (without the skin). Most well-cooked and canned vegetables without skins or seeds. Tender lettuce. Fruits Apple sauce. Fruits canned in juice. Cooked apricots, cherries, grapefruit, peaches, pears, or plums. Fresh bananas and cantaloupe. Meats and other protein foods Baked or boiled chicken. Eggs. Tofu. Fish. Seafood. Smooth nut butters. Ground or well-cooked tender beef, ham, veal, lamb, pork, or poultry. Dairy Plain yogurt, kefir, and unsweetened liquid yogurt. Lactose-free milk, buttermilk, skim milk, or soy milk. Low-fat or nonfat hard cheese. Beverages Water. Low-calorie sports drinks. Fruit juices  without pulp. Strained tomato and vegetable juices. Decaffeinated teas. Sugar-free beverages not sweetened with sugar alcohols. Oral rehydration solutions, if approved by your health care provider. Seasoning and other foods Bouillon, broth, or soups made from recommended foods. What foods are not recommended? The items listed may not be a complete list. Talk with your health care provider about what dietary choices are best for you. Grains Whole grain, whole wheat, bran, or rye breads, rolls, pastas, and crackers. Wild or brown rice. Whole grain or bran cereals. Barley. Oats and oatmeal. Corn  tortillas or taco shells. Granola. Popcorn. Vegetables Raw vegetables. Fried vegetables. Cabbage, broccoli, Brussels sprouts, artichokes, baked beans, beet greens, corn, kale, legumes, peas, sweet potatoes, and yams. Potato skins. Cooked spinach and cabbage. Fruits Dried fruit, including raisins and dates. Raw fruits. Stewed or dried prunes. Canned fruits with syrup. Meat and other protein foods Fried or fatty meats. Deli meats. Chunky nut butters. Nuts and seeds. Beans and lentils. Berniece Salines. Hot dogs. Sausage. Dairy High-fat cheeses. Whole milk, chocolate milk, and beverages made with milk, such as milk shakes. Half-and-half. Cream. sour cream. Ice cream. Beverages Caffeinated beverages (such as coffee, tea, soda, or energy drinks). Alcoholic beverages. Fruit juices with pulp. Prune juice. Soft drinks sweetened with high-fructose corn syrup or sugar alcohols. High-calorie sports drinks. Fats and oils Butter. Cream sauces. Margarine. Salad oils. Plain salad dressings. Olives. Avocados. Mayonnaise. Sweets and desserts Sweet rolls, doughnuts, and sweet breads. Sugar-free desserts sweetened with sugar alcohols such as xylitol and sorbitol. Seasoning and other foods Honey. Hot sauce. Chili powder. Gravy. Cream-based or milk-based soups. Pancakes and waffles. Summary  When you have diarrhea, the foods you eat and your eating habits are very important.  Make sure you get at least 8-10 cups of fluid each day, or enough to keep your urine clear or pale yellow.  Eat bland foods and gradually reintroduce healthy, nutrient-rich foods as tolerated, or as told by your health care provider.  Avoid high-fiber, fried, greasy, or spicy foods. This information is not intended to replace advice given to you by your health care provider. Make sure you discuss any questions you have with your health care provider. Document Released: 05/10/2003 Document Revised: 06/10/2018 Document Reviewed: 02/15/2016 Elsevier  Patient Education  2020 [employer].

## 2018-12-14 ENCOUNTER — Encounter: Payer: Self-pay | Admitting: Family Medicine

## 2018-12-14 LAB — COMPREHENSIVE METABOLIC PANEL WITH GFR
ALT: 29 IU/L (ref 0–44)
AST: 24 IU/L (ref 0–40)
Albumin/Globulin Ratio: 1.9 (ref 1.2–2.2)
Albumin: 4.9 g/dL (ref 4.0–5.0)
Alkaline Phosphatase: 87 IU/L (ref 39–117)
BUN/Creatinine Ratio: 16 (ref 9–20)
BUN: 15 mg/dL (ref 6–24)
Bilirubin Total: 0.8 mg/dL (ref 0.0–1.2)
CO2: 23 mmol/L (ref 20–29)
Calcium: 9.8 mg/dL (ref 8.7–10.2)
Chloride: 104 mmol/L (ref 96–106)
Creatinine, Ser: 0.95 mg/dL (ref 0.76–1.27)
GFR calc Af Amer: 109 mL/min/1.73
GFR calc non Af Amer: 94 mL/min/1.73
Globulin, Total: 2.6 g/dL (ref 1.5–4.5)
Glucose: 100 mg/dL — ABNORMAL HIGH (ref 65–99)
Potassium: 4.4 mmol/L (ref 3.5–5.2)
Sodium: 142 mmol/L (ref 134–144)
Total Protein: 7.5 g/dL (ref 6.0–8.5)

## 2018-12-14 LAB — CBC WITH DIFFERENTIAL/PLATELET

## 2018-12-14 LAB — ACUTE HEP PANEL AND HEP B SURFACE AB
Hep A IgM: NEGATIVE
Hep B C IgM: NEGATIVE
Hep C Virus Ab: <0.1 {s_co_ratio} (ref 0.0–0.9)
Hepatitis B Surf Ab Quant: <3.1 m[IU]/mL — ABNORMAL LOW
Hepatitis B Surface Ag: NEGATIVE

## 2018-12-14 LAB — HEMOGLOBIN A1C
Est. average glucose Bld gHb Est-mCnc: 114 mg/dL
Hgb A1c MFr Bld: 5.6 % (ref 4.8–5.6)

## 2018-12-15 LAB — CBC WITH DIFFERENTIAL/PLATELET
Basophils Absolute: 0.1 x10E3/uL (ref 0.0–0.2)
Basos: 1 %
EOS (ABSOLUTE): 0.1 x10E3/uL (ref 0.0–0.4)
Eos: 2 %
Hematocrit: 47.7 % (ref 37.5–51.0)
Hemoglobin: 15.8 g/dL (ref 13.0–17.7)
Immature Grans (Abs): 0 x10E3/uL (ref 0.0–0.1)
Immature Granulocytes: 0 %
Lymphocytes Absolute: 1.7 x10E3/uL (ref 0.7–3.1)
Lymphs: 28 %
MCH: 32.8 pg (ref 26.6–33.0)
MCHC: 33.1 g/dL (ref 31.5–35.7)
MCV: 99 fL — ABNORMAL HIGH (ref 79–97)
Monocytes Absolute: 0.7 x10E3/uL (ref 0.1–0.9)
Monocytes: 11 %
Neutrophils Absolute: 3.5 x10E3/uL (ref 1.4–7.0)
Neutrophils: 58 %
Platelets: 254 x10E3/uL (ref 150–450)
RBC: 4.82 x10E6/uL (ref 4.14–5.80)
RDW: 13.4 % (ref 11.6–15.4)
WBC: 6.1 x10E3/uL (ref 3.4–10.8)

## 2018-12-15 LAB — SPECIMEN STATUS REPORT

## 2018-12-15 NOTE — Progress Notes (Signed)
Normal lab letter mailed.

## 2018-12-20 NOTE — Progress Notes (Signed)
Patient notified of results & recommendations. Expressed understanding.

## 2018-12-30 ENCOUNTER — Encounter: Payer: Self-pay | Admitting: Gastroenterology

## 2019-01-10 ENCOUNTER — Ambulatory Visit: Payer: Self-pay

## 2019-02-07 ENCOUNTER — Ambulatory Visit (INDEPENDENT_AMBULATORY_CARE_PROVIDER_SITE_OTHER): Payer: Self-pay | Admitting: Gastroenterology

## 2019-02-07 ENCOUNTER — Encounter: Payer: Self-pay | Admitting: Gastroenterology

## 2019-02-07 VITALS — BP 154/88 | HR 105 | Temp 97.5°F | Ht 67.0 in | Wt 177.6 lb

## 2019-02-07 DIAGNOSIS — R1011 Right upper quadrant pain: Secondary | ICD-10-CM

## 2019-02-07 DIAGNOSIS — K529 Noninfective gastroenteritis and colitis, unspecified: Secondary | ICD-10-CM

## 2019-02-07 NOTE — Patient Instructions (Addendum)
If you are age 49 or younger, your body mass index should be between 19-25. Your Body mass index is 27.82 kg/m. If this is out of the aformentioned range listed, please consider follow up with your Primary Care Provider.   It has been recommended to you by your physician that you have a(n) Colonoscopy and Abdomen Ultrasound  completed. Per your request, we did not schedule the procedure(s) today. Please contact our office at [phone] should you decide to have the procedure completed. You will be scheduled for a pre-visit and procedure at that time.  Consider taking Imodium as directed.   We have provided a copy of patient assistance forms that can be completed and returned. Please see instructions on form.   Thank you for choosing me and [facility].  Dr. Ardis Hughs

## 2019-02-07 NOTE — Progress Notes (Signed)
HPI: This is a very pleasant 48 year old man who was referred to me by Bing Neighbors, FNP  to evaluate chronic diarrhea, right-sided abdominal pains.    He has had diarrhea for several years.  He has 2 watery bowel movements every morning and then also in the evening usually.  Never bloody.  He never gets up in the middle the night to move his bowels.  Prior to this change she would have solid stool twice a day.  He thinks the last time he had those type of bowel movements worse at least 2 years ago.  He has also had right upper quadrant abdominal pains for many months.  It is intermittent.  He describes it as a squeezing pain that can last anywhere from 1 to 2 hours and sometimes half a day.  He does not get nauseous during this pain.  The pain is not related to his diarrhea.  He used to drink at least a sixpack of beer every other day but he quit drinking completely about a year ago.  He thinks he has probably lost weight 10 to 12 pounds in the past year   Old Data Reviewed: Lab tests October 2020 showed normal CBC, normal complete metabolic profile, acute viral hepatitis panel was drawn and was negative urinalysis testing was negative    Review of systems: Pertinent positive and negative review of systems were noted in the above HPI section. All other review negative.   Past Medical History:  Diagnosis Date  . Allergies   . Hypertension     Past Surgical History:  Procedure Laterality Date  . NASAL SEPTUM SURGERY  2008  . WISDOM TOOTH EXTRACTION     age 54     Current Outpatient Medications  Medication Sig Dispense Refill  . amLODipine (NORVASC) 10 MG tablet Take 1 tablet (10 mg total) by mouth daily. 90 tablet 3  . loratadine (CLARITIN) 10 MG tablet Take 10 mg by mouth daily.     No current facility-administered medications for this visit.     Allergies as of 02/07/2019 - Review Complete 02/07/2019  Allergen Reaction Noted  . Shellfish allergy Nausea And Vomiting  08/21/2012    Family History  Problem Relation Age of Onset  . Diabetes Mother   . Diabetes Father   . Hypertension Father   . Diabetes Brother   . Hypertension Brother   . Diabetes Maternal Grandmother   . Skin cancer Maternal Grandmother   . Diabetes Maternal Grandfather   . Diabetes Paternal Grandmother   . Diabetes Paternal Grandfather   . Stomach cancer Maternal Uncle     Social History   Socioeconomic History  . Marital status: Significant Other    Spouse name: Not on file  . Number of children: Not on file  . Years of education: Not on file  . Highest education level: Not on file  Occupational History  . Not on file  Social Needs  . Financial resource strain: Not on file  . Food insecurity    Worry: Not on file    Inability: Not on file  . Transportation needs    Medical: Not on file    Non-medical: Not on file  Tobacco Use  . Smoking status: Former Smoker    Types: Cigarettes    Quit date: 2014    Years since quitting: 6.9  . Smokeless tobacco: Never Used  Substance and Sexual Activity  . Alcohol use: Yes    Comment: rare   .  Drug use: Never  . Sexual activity: Not on file  Lifestyle  . Physical activity    Days per week: Not on file    Minutes per session: Not on file  . Stress: Not on file  Relationships  . Social Herbalist on phone: Not on file    Gets together: Not on file    Attends religious service: Not on file    Active member of club or organization: Not on file    Attends meetings of clubs or organizations: Not on file    Relationship status: Not on file  . Intimate partner violence    Fear of current or ex partner: Not on file    Emotionally abused: Not on file    Physically abused: Not on file    Forced sexual activity: Not on file  Other Topics Concern  . Not on file  Social History Narrative  . Not on file     Physical Exam: BP (!) 154/88   Pulse (!) 105   Temp (!) 97.5 F (36.4 C)   Ht 5\' 7"  (1.702 m)   Wt  177 lb 9.6 oz (80.6 kg)   BMI 27.82 kg/m  Constitutional: generally well-appearing Psychiatric: alert and oriented x3 Eyes: extraocular movements intact Mouth: oral pharynx moist, no lesions Neck: supple no lymphadenopathy Cardiovascular: heart regular rate and rhythm Lungs: clear to auscultation bilaterally Abdomen: soft, nontender, nondistended, no obvious ascites, no peritoneal signs, normal bowel sounds Extremities: no lower extremity edema bilaterally Skin: no lesions on visible extremities   Assessment and plan: 48 y.o. male with chronic diarrhea, right upper quadrant abdominal pain  First I think he is probably having biliary colic.  I recommended abdominal ultrasound to check for gallstones and other potential causes of his abdominal pains.  If gallstones are proven then I would likely send him to see a surgeon to consider elective cholecystectomy.  He has had diarrhea, loose stools for about 2 years now.  I recommended trial of Imodium once daily, stool studies, colonoscopy.  He does not have insurance and he asked very reasonable question about how much the above testing will cost him.  We will see if we can answer those questions to help him with his decision-making.  We will also give him information about [employer] payment plan information.  Please see the "Patient Instructions" section for addition details about the plan.   Owens Loffler, MD [facility] 02/07/2019, 3:02 PM  Cc: Scot Jun, FNP

## 2019-03-11 ENCOUNTER — Other Ambulatory Visit: Payer: Self-pay | Admitting: Family Medicine

## 2019-03-15 ENCOUNTER — Other Ambulatory Visit: Payer: Self-pay | Admitting: Family Medicine

## 2019-03-15 ENCOUNTER — Telehealth: Payer: Self-pay | Admitting: Family Medicine

## 2019-03-15 MED ORDER — AMLODIPINE BESYLATE 10 MG PO TABS: 10.0000 mg | ORAL_TABLET | Freq: Every day | ORAL | 0 refills | Status: DC

## 2019-03-15 NOTE — Telephone Encounter (Signed)
1) Medication(s) Requested (by name): amLODipine (NORVASC) 10 MG tablet [[id number]]   2) Pharmacy of Choice: [id number] - [place] ([place]), [place] - [address]  [address], [place] ([place]) [place] [address]     Approved medications will be sent to pharmacy, we will reach out to you if there is an issue.  Requests made after 3pm may not be addressed until following business day!

## 2019-03-15 NOTE — Telephone Encounter (Signed)
Refill sent.  Patient will need follow up appointment prior to receiving refills.

## 2019-03-18 ENCOUNTER — Other Ambulatory Visit: Payer: Self-pay | Admitting: Family Medicine

## 2019-05-16 IMAGING — CT CT CHEST W/O CM
2 of 4 series · 15 of 36 positions shown, 18 images · non-contrast
Comparison: Cardiac CTA 08/22/2012.

CLINICAL DATA: 47-year-old male with history of chest pain and
tightness. Mild shortness of breath. No fevers.

EXAM:
CT CHEST WITHOUT CONTRAST
TECHNIQUE: Multidetector CT imaging of the chest was performed following the
standard protocol without IV contrast.

[Series 2: thorax · axial · 0.79mm/px · z∈[-110,+166]mm · 12 of 162 slices shown, 15 images]
[im 12/162  mediastinal]
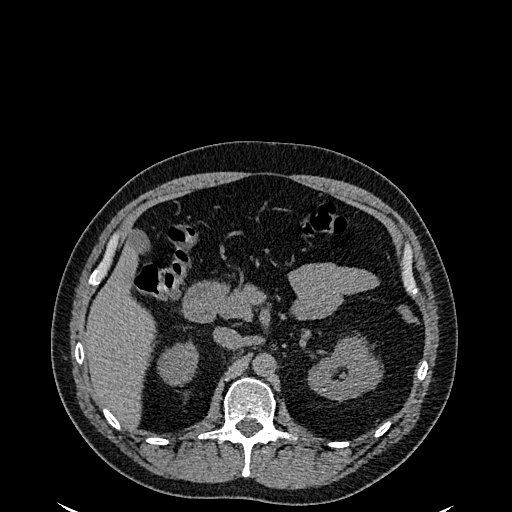
[im 12/162  lung]
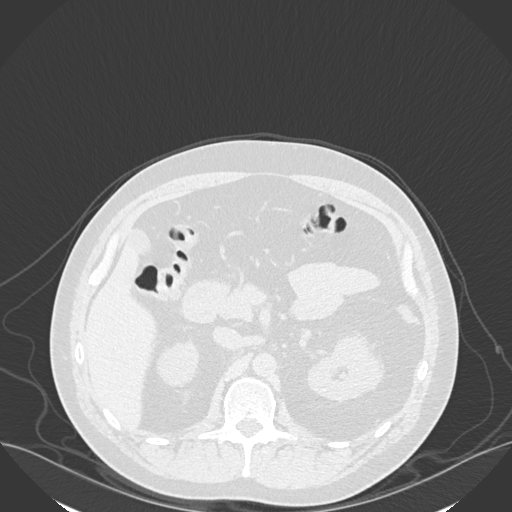
[im 24/162  lung]
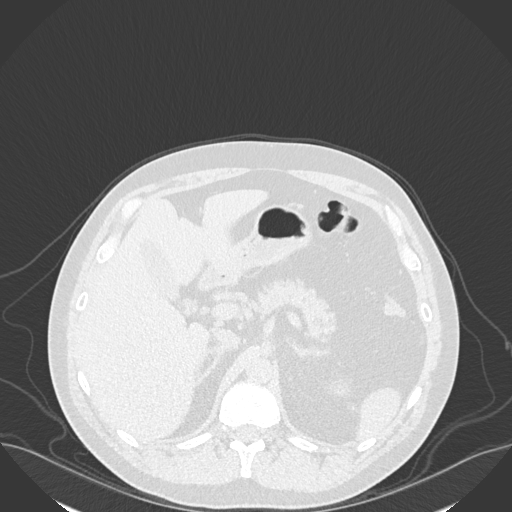
[im 35/162  lung]
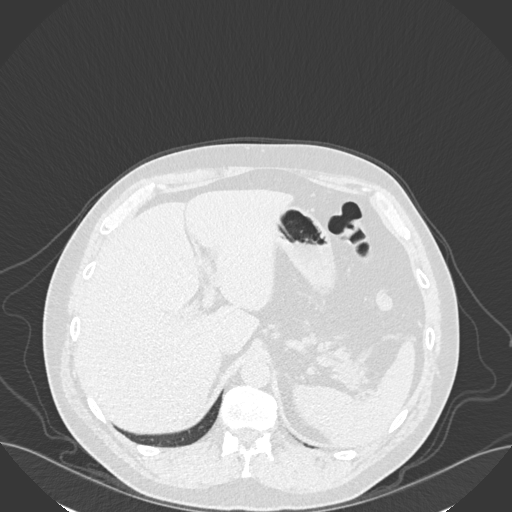
[im 47/162  lung]
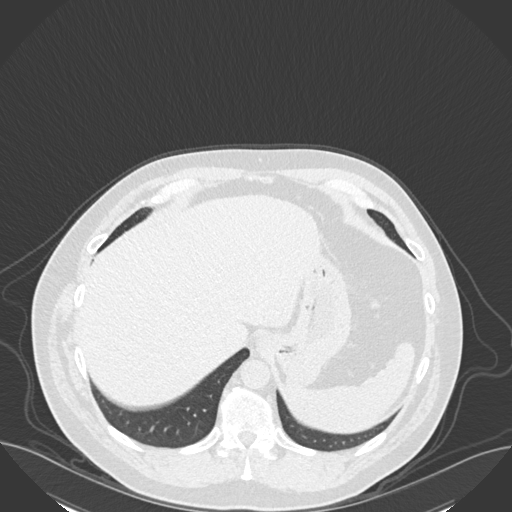
[im 58/162  mediastinal]
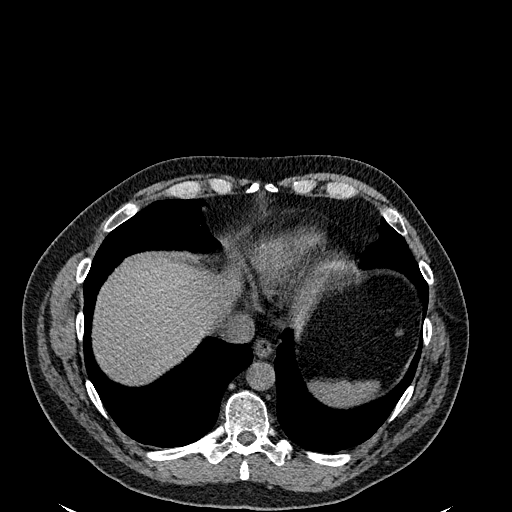
[im 58/162  lung]
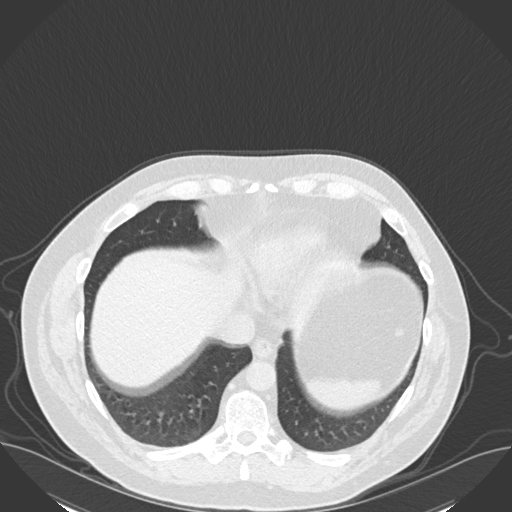
[im 70/162  lung]
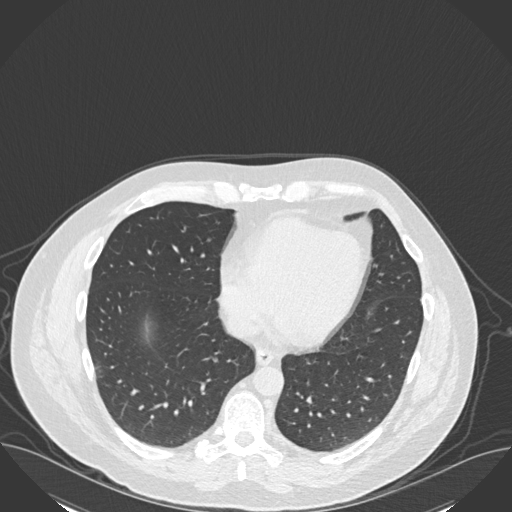
[im 93/162  lung]
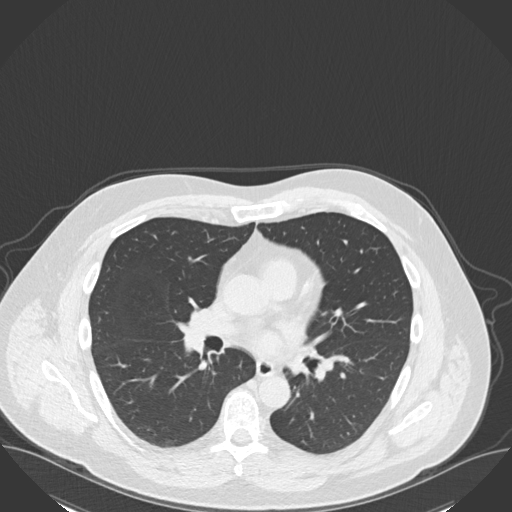
[im 104/162  lung]
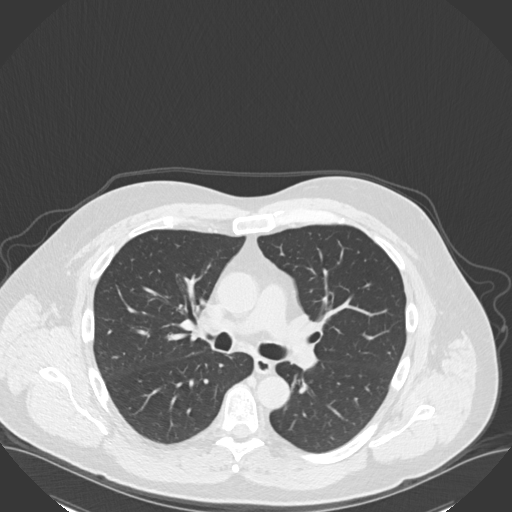
[im 116/162  mediastinal]
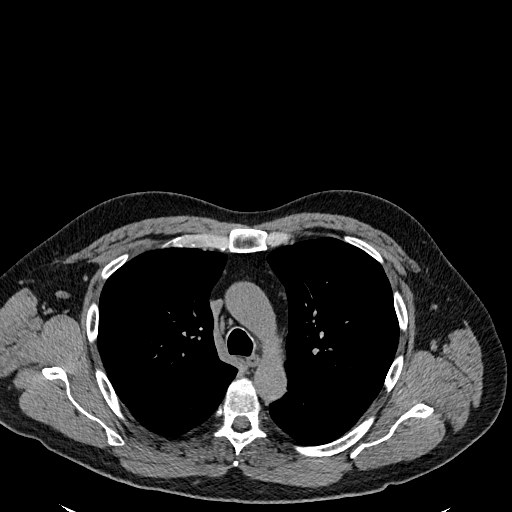
[im 116/162  lung]
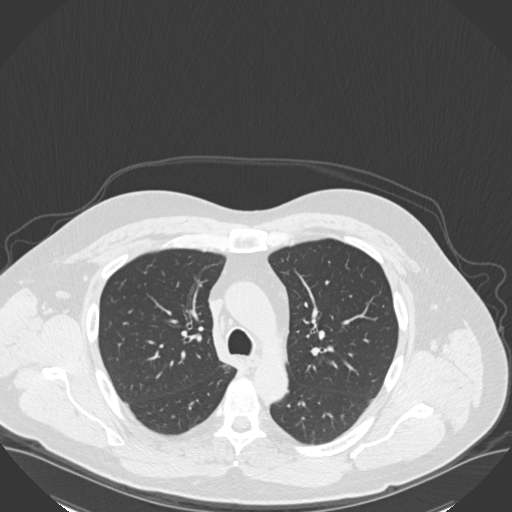
[im 127/162  lung]
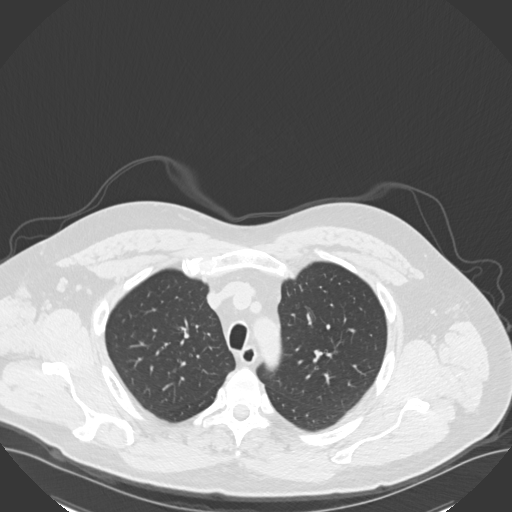
[im 139/162  lung]
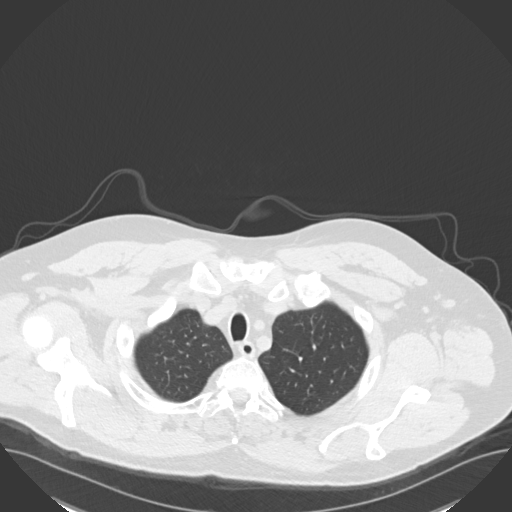
[im 150/162  lung]
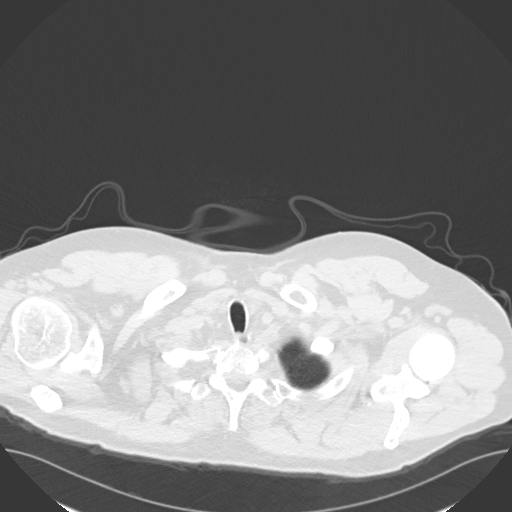

[Series 5: coronal · coronal · 0.70mm/px · 3 of 122 slices shown]
[im 25/122  lung]
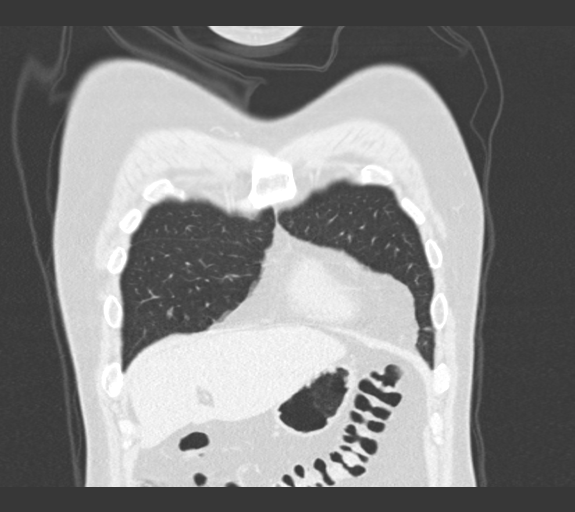
[im 49/122  lung]
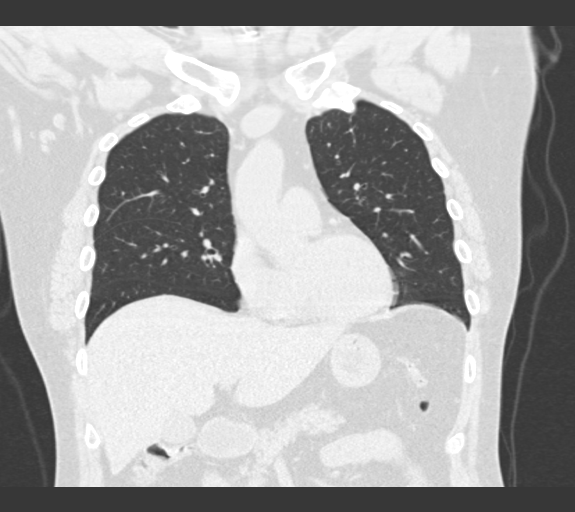
[im 73/122  lung]
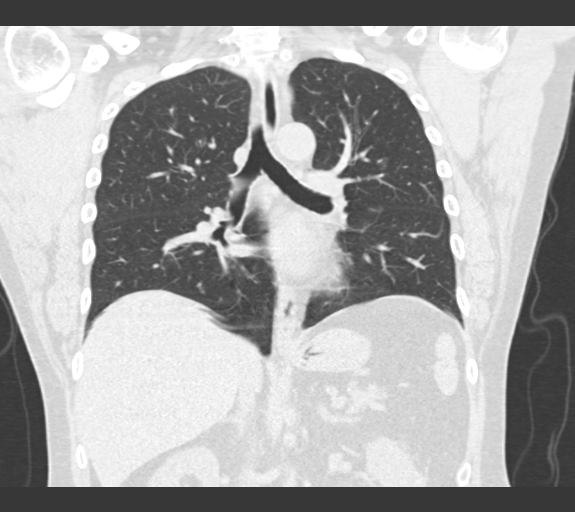

[15 of 36 positions shown; findings below may reference images not displayed]

FINDINGS: Cardiovascular: Heart size is normal. There is no significant
pericardial fluid, thickening or pericardial calcification. No
atherosclerotic calcifications in the thoracic aorta or the coronary
arteries.

Mediastinum/Nodes: No pathologically enlarged mediastinal or hilar
lymph nodes. Please note that accurate exclusion of hilar adenopathy
is limited on noncontrast CT scans. Esophagus is unremarkable in
appearance. No axillary lymphadenopathy.

Lungs/Pleura: Tiny pulmonary nodules in the right lung, largest of
which is in the lateral segment of the right middle lobe measuring
only 4 mm (axial image 93 of series 7). These are stable compared to
prior study 08/22/2012 and considered definitively benign. No other
larger more suspicious appearing pulmonary nodules or masses are
noted. No acute consolidative airspace disease. No pleural
effusions.

Upper Abdomen: Unremarkable.

Musculoskeletal: There are no aggressive appearing lytic or blastic
lesions noted in the visualized portions of the skeleton.
IMPRESSION: 1. No acute findings are noted in the thorax to account for the
patient's symptoms.
2. Multiple small pulmonary nodules, stable dating back to 5042,
considered definitively benign.

## 2019-06-28 ENCOUNTER — Encounter: Payer: Self-pay | Admitting: Internal Medicine

## 2019-06-28 ENCOUNTER — Telehealth (INDEPENDENT_AMBULATORY_CARE_PROVIDER_SITE_OTHER): Payer: Self-pay | Admitting: Internal Medicine

## 2019-06-28 DIAGNOSIS — I10 Essential (primary) hypertension: Secondary | ICD-10-CM

## 2019-06-28 MED ORDER — AMLODIPINE BESYLATE 10 MG PO TABS: 10.0000 mg | ORAL_TABLET | Freq: Every day | ORAL | 1 refills | Status: DC

## 2019-06-28 NOTE — Progress Notes (Signed)
Virtual Visit via Telephone Note  I connected with Andre Garrett, on 06/28/2019 at 2:17 PM by telephone due to the COVID-19 pandemic and verified that I am speaking with the correct person using two identifiers.   Consent: I discussed the limitations, risks, security and privacy concerns of performing an evaluation and management service by telephone and the availability of in person appointments. I also discussed with the patient that there may be a patient responsible charge related to this service. The patient expressed understanding and agreed to proceed.   Location of Patient: Home   Location of Provider: Clinic    Persons participating in Telemedicine visit: Muriel Wilber [facility] Dr. Earlene Plater      History of Present Illness: Patient has a visit to f/u on HTN.   Chronic HTN Disease Monitoring:  Home BP Monitoring - 120s/80s as average  Chest pain- no  Dyspnea- no Headache - no  Medications: Amlodipine 10 mg  Compliance- yes Lightheadedness- no  Edema- no     Past Medical History:  Diagnosis Date  . Allergies   . Hypertension    Allergies  Allergen Reactions  . Shellfish Allergy Nausea And Vomiting    Current Outpatient Medications on File Prior to Visit  Medication Sig Dispense Refill  . amLODipine (NORVASC) 10 MG tablet Take 1 tablet (10 mg total) by mouth daily. 90 tablet 0  . loratadine (CLARITIN) 10 MG tablet Take 10 mg by mouth daily.     No current facility-administered medications on file prior to visit.    Observations/Objective: NAD. Speaking clearly.  Work of breathing normal.  Alert and oriented. Mood appropriate.   Assessment and Plan: 1. Essential hypertension BP at goal. Continue current regimen. Continue home monitoring.  Counseled on blood pressure goal of less than 130/80, low-sodium, DASH diet, medication compliance, 150 minutes of moderate intensity exercise per week. Discussed medication compliance,  adverse effects. - amLODipine (NORVASC) 10 MG tablet; Take 1 tablet (10 mg total) by mouth daily.  Dispense: 90 tablet; Refill: 1   Follow Up Instructions: Return in 6 months for f/u HTN and annual exam    I discussed the assessment and treatment plan with the patient. The patient was provided an opportunity to ask questions and all were answered. The patient agreed with the plan and demonstrated an understanding of the instructions.   The patient was advised to call back or seek an in-person evaluation if the symptoms worsen or if the condition fails to improve as anticipated.     I provided 12 minutes total of non-face-to-face time during this encounter including median intraservice time, reviewing previous notes, investigations, ordering medications, medical decision making, coordinating care and patient verbalized understanding at the end of the visit.    Marcy Siren, D.O. [facility] at [facility]  06/28/2019, 2:17 PM

## 2019-11-23 ENCOUNTER — Encounter: Payer: Self-pay | Admitting: Family Medicine

## 2019-12-07 ENCOUNTER — Encounter: Payer: Self-pay | Admitting: Internal Medicine

## 2020-01-05 ENCOUNTER — Telehealth: Payer: Self-pay

## 2020-01-05 DIAGNOSIS — I10 Essential (primary) hypertension: Secondary | ICD-10-CM

## 2020-01-05 NOTE — Telephone Encounter (Signed)
1) Medication(s) Requested (by name):amLODipine (NORVASC) 10 MG tablet    2) Pharmacy of Choice:[id number] - [place] ([place]), [place] - [address]  [address], [place] ([place]) [place]   3) Special Requests:   Approved medications will be sent to the pharmacy, we will reach out if there is an issue.  Requests made after 3pm may not be addressed until the following business day!  If a patient is unsure of the name of the medication(s) please note and ask patient to call back when they are able to provide all info, do not send to responsible party until all information is available!

## 2020-01-06 MED ORDER — AMLODIPINE BESYLATE 10 MG PO TABS: 10.0000 mg | ORAL_TABLET | Freq: Every day | ORAL | 0 refills | Status: DC

## 2020-01-25 ENCOUNTER — Encounter: Payer: Self-pay | Admitting: Internal Medicine

## 2020-02-02 ENCOUNTER — Ambulatory Visit
Admission: EM | Admit: 2020-02-02 | Discharge: 2020-02-02 | Disposition: A | Discharge status: Home / Self Care | Payer: Self-pay | Attending: Emergency Medicine | Admitting: Emergency Medicine

## 2020-02-02 ENCOUNTER — Other Ambulatory Visit: Payer: Self-pay

## 2020-02-02 DIAGNOSIS — R739 Hyperglycemia, unspecified: Secondary | ICD-10-CM

## 2020-02-02 DIAGNOSIS — I10 Essential (primary) hypertension: Secondary | ICD-10-CM

## 2020-02-02 LAB — POCT FASTING CBG KUC MANUAL ENTRY: POCT Glucose (KUC): 145 mg/dL — AB (ref 70–99)

## 2020-02-02 MED ORDER — AMLODIPINE BESYLATE 10 MG PO TABS: 10.0000 mg | ORAL_TABLET | Freq: Every day | ORAL | 0 refills | Status: DC

## 2020-02-02 NOTE — ED Provider Notes (Signed)
[facility]    CSN: [id number] Arrival date & time: 02/02/20  1539      History   Chief Complaint Chief Complaint  Patient presents with  . blurred vision    HPI Andre Garrett is a 49 y.o. male history of hypertension, tobacco use, presenting today for evaluation of diabetes.  Patient reports that over the past week he has noticed increased blurry vision as well as tingling sensations in his feet.  He expresses concerns about possible underlying diabetes.  He reports significant family history of diabetes.  He was last screened for diabetes approximately 1 year ago with normal A1c.  He denies eating any food/sugary foods prior to arrival.  He also reports that he has been out of his blood pressure medicine for approximately 1 week.  Recently had it refilled, but misplaced it.  He denies any chest pain or shortness of breath.  Denies any significant headaches.  HPI  Past Medical History:  Diagnosis Date  . Allergies   . Hypertension     Patient Active Problem List   Diagnosis Date Noted  . Lung nodule 08/22/2012  . Alcohol abuse 08/21/2012  . Tobacco abuse 08/21/2012  . HTN (hypertension) 08/21/2012  . Allergic rhinitis 08/21/2012    Past Surgical History:  Procedure Laterality Date  . NASAL SEPTUM SURGERY  2008  . WISDOM TOOTH EXTRACTION     age 37        Home Medications    Prior to Admission medications   Medication Sig Start Date End Date Taking? Authorizing Provider  amLODipine (NORVASC) 10 MG tablet Take 1 tablet (10 mg total) by mouth daily. 02/02/20   Wilkins Elpers C, PA-C  loratadine (CLARITIN) 10 MG tablet Take 10 mg by mouth daily.    [provider]    Family History Family History  Problem Relation Age of Onset  . Diabetes Mother   . Diabetes Father   . Hypertension Father   . Diabetes Brother   . Hypertension Brother   . Diabetes Maternal Grandmother   . Skin cancer Maternal Grandmother   . Diabetes Maternal  Grandfather   . Diabetes Paternal Grandmother   . Diabetes Paternal Grandfather   . Stomach cancer Maternal Uncle     Social History Social History   Tobacco Use  . Smoking status: Former Smoker    Types: Cigarettes    Quit date: 2014    Years since quitting: 7.9  . Smokeless tobacco: Never Used  Vaping Use  . Vaping Use: Never used  Substance Use Topics  . Alcohol use: Yes    Comment: rare   . Drug use: Never     Allergies   Shellfish allergy   Review of Systems Review of Systems  Constitutional: Negative for fatigue and fever.  HENT: Negative for congestion, sinus pressure and sore throat.   Eyes: Positive for visual disturbance. Negative for photophobia and pain.  Respiratory: Negative for cough and shortness of breath.   Cardiovascular: Negative for chest pain.  Gastrointestinal: Negative for abdominal pain, nausea and vomiting.  Genitourinary: Negative for decreased urine volume and hematuria.  Musculoskeletal: Negative for myalgias, neck pain and neck stiffness.  Neurological: Negative for dizziness, syncope, facial asymmetry, speech difficulty, weakness, light-headedness, numbness and headaches.     Physical Exam Triage Vital Signs ED Triage Vitals [02/02/20 1657]  Enc Vitals Group     BP (!) 169/99     Pulse Rate (!) 112     Resp  18     Temp 98.7 F (37.1 C)     Temp Source Oral     SpO2 94 %     Weight      Height      Head Circumference      Peak Flow      Pain Score 0     Pain Loc      Pain Edu?      Excl. in GC?    No data found.  Updated Vital Signs BP (!) 169/99 (BP Location: Left Arm)   Pulse (!) 112   Temp 98.7 F (37.1 C) (Oral)   Resp 18   SpO2 94%   Visual Acuity Right Eye Distance:   Left Eye Distance:   Bilateral Distance:    Right Eye Near:   Left Eye Near:    Bilateral Near:     Physical Exam Vitals and nursing note reviewed.  Constitutional:      Appearance: He is well-developed.     Comments: No acute  distress  HENT:     Head: Normocephalic and atraumatic.     Ears:     Comments:      Nose: Nose normal.     Mouth/Throat:     Comments: Oral mucosa pink and moist, no tonsillar enlargement or exudate. Posterior pharynx patent and nonerythematous, no uvula deviation or swelling. Normal phonation. Eyes:     Extraocular Movements: Extraocular movements intact.     Conjunctiva/sclera: Conjunctivae normal.  Cardiovascular:     Rate and Rhythm: Normal rate.  Pulmonary:     Effort: Pulmonary effort is normal. No respiratory distress.     Comments: Breathing comfortably at rest, CTABL, no wheezing, rales or other adventitious sounds auscultated Abdominal:     General: There is no distension.  Musculoskeletal:        General: Normal range of motion.     Cervical back: Neck supple.  Skin:    General: Skin is warm and dry.  Neurological:     Mental Status: He is alert and oriented to person, place, and time.      UC Treatments / Results  Labs (all labs ordered are listed, but only abnormal results are displayed) Labs Reviewed  POCT FASTING CBG [facility] MANUAL ENTRY - Abnormal; Notable for the following components:      Result Value   POCT Glucose ([facility]) 145 (*)    All other components within normal limits  HEMOGLOBIN A1C  COMPREHENSIVE METABOLIC PANEL    EKG   Radiology No results found.  Procedures Procedures (including critical care time)  Medications Ordered in UC Medications - No data to display  Initial Impression / Assessment and Plan / UC Course  I have reviewed the triage vital signs and the nursing notes.  Pertinent labs & imaging results that were available during my care of the patient were reviewed by me and considered in my medical decision making (see chart for details).     Point-of-care blood sugar 145 today, will check A1c, if elevated will initiate on Metformin, encourage follow-up with PCP.  CMP to check kidney/liver function and safety for initiating  Metformin.  Refilling amlodipine also restart and continue to monitor blood pressure.  Monitor for resolution of symptoms with improvement in pressure as well.  If continuing to have changes in vision recommended following up with ophthalmology for further evaluation of vision.  No red flags today.  Discussed strict return precautions. Patient verbalized understanding and is agreeable with  plan.  Final Clinical Impressions(s) / UC Diagnoses   Final diagnoses:  Hyperglycemia  Essential hypertension     Discharge Instructions     Blood sugar 145 today- I am checking your A1C- we will call with results Refilled BP medicine, restart Monitor symptoms, follow up if not improving or worsening   ED Prescriptions    Medication Sig Dispense Auth. Provider   amLODipine (NORVASC) 10 MG tablet Take 1 tablet (10 mg total) by mouth daily. 90 tablet Tekela Garguilo, [place] C, PA-C     PDMP not reviewed this encounter.   Lew Dawes, [place] 02/02/20 2106

## 2020-02-02 NOTE — ED Triage Notes (Signed)
Pt requesting his blood sugar checked d/t blurred vision and tingling in his feet x1wk. States family hx of DM's. States lost his b/p meds a week ago.

## 2020-02-02 NOTE — Discharge Instructions (Signed)
Blood sugar 145 today- I am checking your A1C- we will call with results Refilled BP medicine, restart Monitor symptoms, follow up if not improving or worsening

## 2020-02-03 LAB — COMPREHENSIVE METABOLIC PANEL
ALT: 19 IU/L (ref 0–44)
AST: 19 IU/L (ref 0–40)
Albumin/Globulin Ratio: 1.7 (ref 1.2–2.2)
Albumin: 4.8 g/dL (ref 4.0–5.0)
Alkaline Phosphatase: 108 IU/L (ref 44–121)
BUN/Creatinine Ratio: 16 (ref 9–20)
BUN: 23 mg/dL (ref 6–24)
Bilirubin Total: 0.8 mg/dL (ref 0.0–1.2)
CO2: 21 mmol/L (ref 20–29)
Calcium: 9.4 mg/dL (ref 8.7–10.2)
Chloride: 107 mmol/L — ABNORMAL HIGH (ref 96–106)
Creatinine, Ser: 1.44 mg/dL — ABNORMAL HIGH (ref 0.76–1.27)
GFR calc Af Amer: 65 mL/min/{1.73_m2} (ref >59)
GFR calc non Af Amer: 57 mL/min/{1.73_m2} — ABNORMAL LOW (ref >59)
Globulin, Total: 2.9 g/dL (ref 1.5–4.5)
Glucose: 118 mg/dL — ABNORMAL HIGH (ref 65–99)
Potassium: 4.5 mmol/L (ref 3.5–5.2)
Sodium: 146 mmol/L — ABNORMAL HIGH (ref 134–144)
Total Protein: 7.7 g/dL (ref 6.0–8.5)

## 2020-02-03 LAB — HEMOGLOBIN A1C
Est. average glucose Bld gHb Est-mCnc: 128 mg/dL
Hgb A1c MFr Bld: 6.1 % — ABNORMAL HIGH (ref 4.8–5.6)

## 2020-07-18 ENCOUNTER — Ambulatory Visit
Admission: RE | Admit: 2020-07-18 | Discharge: 2020-07-18 | Disposition: A | Discharge status: Home / Self Care | Payer: Self-pay | Source: Ambulatory Visit | Attending: Emergency Medicine | Admitting: Emergency Medicine

## 2020-07-18 ENCOUNTER — Other Ambulatory Visit: Payer: Self-pay

## 2020-07-18 VITALS — BP 160/97 | HR 89 | Temp 99.3°F | Resp 18

## 2020-07-18 DIAGNOSIS — M109 Gout, unspecified: Secondary | ICD-10-CM

## 2020-07-18 DIAGNOSIS — I10 Essential (primary) hypertension: Secondary | ICD-10-CM

## 2020-07-18 MED ORDER — AMLODIPINE BESYLATE 10 MG PO TABS: 10.0000 mg | ORAL_TABLET | Freq: Every day | ORAL | 0 refills | Status: DC

## 2020-07-18 MED ORDER — INDOMETHACIN 50 MG PO CAPS: 50.0000 mg | ORAL_CAPSULE | Freq: Three times a day (TID) | ORAL | 0 refills | Status: DC | PRN

## 2020-07-18 NOTE — ED Triage Notes (Signed)
Pt c/o gout to rt foot x1 wk. States out of amlodipine x2wks and unable to get a hold of anyone at his PCP.

## 2020-07-18 NOTE — Discharge Instructions (Signed)
Amlodipine refilled, continue to monitor blood pressure May use indomethacin every 8 hours for toe pain/swelling I am checking your kidney function, if continued to be elevated I will call and switch you to prednisone  Please follow-up as needed

## 2020-07-18 NOTE — ED Provider Notes (Signed)
[facility]    CSN: [id number] Arrival date & time: 07/18/20  1535      History   Chief Complaint Chief Complaint  Patient presents with  . Gout  . Medication Refill  . appt 4    HPI Andre Garrett is a 50 y.o. male history of hypertension, gout, presenting today for evaluation of medication refill and toe pain.  Patient reports that he has been out of amlodipine for approximately 2 weeks and blood pressure has been elevated.  He denies any symptoms.  Reports that previously controlled on amlodipine.  Denies any problems with amlodipine.  Denies leg pain or leg swelling.  Having difficulty getting in with his PCP, provider recently left.  Also reports 1 week of right great toe pain, history of similar with gout.  Took indomethacin from a friend which has improved some, but overall has not been taking anything.  .  Prior visit on 12/21 creatinine elevated at 1.44 .  HPI  Past Medical History:  Diagnosis Date  . Allergies   . Hypertension     Patient Active Problem List   Diagnosis Date Noted  . Lung nodule 08/22/2012  . Alcohol abuse 08/21/2012  . Tobacco abuse 08/21/2012  . HTN (hypertension) 08/21/2012  . Allergic rhinitis 08/21/2012    Past Surgical History:  Procedure Laterality Date  . NASAL SEPTUM SURGERY  2008  . WISDOM TOOTH EXTRACTION     age 75        Home Medications    Prior to Admission medications   Medication Sig Start Date End Date Taking? Authorizing Provider  indomethacin (INDOCIN) 50 MG capsule Take 1 capsule (50 mg total) by mouth 3 (three) times daily as needed. 07/18/20  Yes Luci Bellucci C, PA-C  amLODipine (NORVASC) 10 MG tablet Take 1 tablet (10 mg total) by mouth daily. 07/18/20   Marci Polito C, PA-C  loratadine (CLARITIN) 10 MG tablet Take 10 mg by mouth daily.    [provider]    Family History Family History  Problem Relation Age of Onset  . Diabetes Mother   . Diabetes Father   . Hypertension  Father   . Diabetes Brother   . Hypertension Brother   . Diabetes Maternal Grandmother   . Skin cancer Maternal Grandmother   . Diabetes Maternal Grandfather   . Diabetes Paternal Grandmother   . Diabetes Paternal Grandfather   . Stomach cancer Maternal Uncle     Social History Social History   Tobacco Use  . Smoking status: Former Smoker    Types: Cigarettes    Quit date: 2014    Years since quitting: 8.3  . Smokeless tobacco: Never Used  Vaping Use  . Vaping Use: Never used  Substance Use Topics  . Alcohol use: Yes    Comment: rare   . Drug use: Never     Allergies   Shellfish allergy   Review of Systems Review of Systems  Constitutional: Negative for fatigue and fever.  HENT: Negative for congestion, sinus pressure and sore throat.   Eyes: Negative for photophobia, pain and visual disturbance.  Respiratory: Negative for cough and shortness of breath.   Cardiovascular: Negative for chest pain.  Gastrointestinal: Negative for abdominal pain, nausea and vomiting.  Genitourinary: Negative for decreased urine volume and hematuria.  Musculoskeletal: Positive for arthralgias and joint swelling. Negative for myalgias, neck pain and neck stiffness.  Neurological: Negative for dizziness, syncope, facial asymmetry, speech difficulty, weakness, light-headedness, numbness and headaches.  Physical Exam Triage Vital Signs ED Triage Vitals [07/18/20 1611]  Enc Vitals Group     BP (!) 160/97     Pulse Rate 89     Resp 18     Temp 99.3 F (37.4 C)     Temp Source Oral     SpO2 96 %     Weight      Height      Head Circumference      Peak Flow      Pain Score 4     Pain Loc      Pain Edu?      Excl. in GC?    No data found.  Updated Vital Signs BP (!) 160/97 (BP Location: Left Arm)   Pulse 89   Temp 99.3 F (37.4 C) (Oral)   Resp 18   SpO2 96%   Visual Acuity Right Eye Distance:   Left Eye Distance:   Bilateral Distance:    Right Eye Near:   Left  Eye Near:    Bilateral Near:     Physical Exam Vitals and nursing note reviewed.  Constitutional:      Appearance: He is well-developed.     Comments: No acute distress  HENT:     Head: Normocephalic and atraumatic.     Nose: Nose normal.  Eyes:     Extraocular Movements: Extraocular movements intact.     Conjunctiva/sclera: Conjunctivae normal.     Pupils: Pupils are equal, round, and reactive to light.  Cardiovascular:     Rate and Rhythm: Normal rate and regular rhythm.  Pulmonary:     Effort: Pulmonary effort is normal. No respiratory distress.     Comments: Breathing comfortably at rest, CTABL, no wheezing, rales or other adventitious sounds auscultated Abdominal:     General: There is no distension.  Musculoskeletal:        General: Normal range of motion.     Cervical back: Neck supple.     Comments: Right great toe with erythema and swelling and slight warmth  No leg swelling  Skin:    General: Skin is warm and dry.  Neurological:     Mental Status: He is alert and oriented to person, place, and time.      UC Treatments / Results  Labs (all labs ordered are listed, but only abnormal results are displayed) Labs Reviewed  COMPREHENSIVE METABOLIC PANEL    EKG   Radiology No results found.  Procedures Procedures (including critical care time)  Medications Ordered in UC Medications - No data to display  Initial Impression / Assessment and Plan / UC Course  I have reviewed the triage vital signs and the nursing notes.  Pertinent labs & imaging results that were available during my care of the patient were reviewed by me and considered in my medical decision making (see chart for details).     Hypertension-refilling amlodipine, continue to monitor pressure while restarting amlodipine and follow-up with primary care for further treatment and monitoring of hypertension  Gout-improvement with indomethacin, discussed rechecking kidney function to ensure  this is okay, will prescribe, but advised if kidney function continuing to stay elevated I will switch him to a course of prednisone as alternative.  Discussed strict return precautions. Patient verbalized understanding and is agreeable with plan.  Final Clinical Impressions(s) / UC Diagnoses   Final diagnoses:  Essential hypertension  Acute gout involving toe of right foot, unspecified cause     Discharge Instructions  Amlodipine refilled, continue to monitor blood pressure May use indomethacin every 8 hours for toe pain/swelling I am checking your kidney function, if continued to be elevated I will call and switch you to prednisone  Please follow-up as needed    ED Prescriptions    Medication Sig Dispense Auth. Provider   amLODipine (NORVASC) 10 MG tablet Take 1 tablet (10 mg total) by mouth daily. 90 tablet Chrisette Man C, PA-C   indomethacin (INDOCIN) 50 MG capsule Take 1 capsule (50 mg total) by mouth 3 (three) times daily as needed. 30 capsule Maleigh Bagot, [place] C, PA-C     PDMP not reviewed this encounter.   Lew Dawes, [place] 07/18/20 1651

## 2020-07-19 LAB — COMPREHENSIVE METABOLIC PANEL
ALT: 20 IU/L (ref 0–44)
AST: 19 IU/L (ref 0–40)
Albumin/Globulin Ratio: 1.6 (ref 1.2–2.2)
Albumin: 4.5 g/dL (ref 4.0–5.0)
Alkaline Phosphatase: 99 IU/L (ref 44–121)
BUN/Creatinine Ratio: 21 — ABNORMAL HIGH (ref 9–20)
BUN: 19 mg/dL (ref 6–24)
Bilirubin Total: 0.8 mg/dL (ref 0.0–1.2)
CO2: 21 mmol/L (ref 20–29)
Calcium: 9.7 mg/dL (ref 8.7–10.2)
Chloride: 100 mmol/L (ref 96–106)
Creatinine, Ser: 0.9 mg/dL (ref 0.76–1.27)
Globulin, Total: 2.9 g/dL (ref 1.5–4.5)
Glucose: 91 mg/dL (ref 65–99)
Potassium: 4.5 mmol/L (ref 3.5–5.2)
Sodium: 140 mmol/L (ref 134–144)
Total Protein: 7.4 g/dL (ref 6.0–8.5)
eGFR: 104 mL/min/{1.73_m2} (ref >59)

## 2020-07-27 ENCOUNTER — Telehealth: Payer: Self-pay

## 2020-07-27 NOTE — Telephone Encounter (Signed)
Patient called and asked to receive his lab results from the urgent care done on Wednesday, 07/18/20. I advised of the results below, unremarkable. Patient verbalized understanding and asked what should he do about the symptoms of knots in his stomach since having COVID. I advised follow up with PCP. He verbalized understanding and says he will call when the office opens on Tuesday for an appointment.  Aaron Edelman, RN  07/19/2020 9:08 AM EDT Back to Top     Reviewed, unremarkable

## 2020-09-27 ENCOUNTER — Other Ambulatory Visit: Payer: Self-pay

## 2020-09-27 ENCOUNTER — Ambulatory Visit (INDEPENDENT_AMBULATORY_CARE_PROVIDER_SITE_OTHER): Payer: Self-pay | Admitting: Family

## 2020-09-27 ENCOUNTER — Encounter: Payer: Self-pay | Admitting: Family

## 2020-09-27 VITALS — BP 178/96 | HR 72 | Temp 98.1°F | Resp 18 | Ht 67.01 in | Wt 175.0 lb

## 2020-09-27 DIAGNOSIS — I10 Essential (primary) hypertension: Secondary | ICD-10-CM

## 2020-09-27 DIAGNOSIS — R7303 Prediabetes: Secondary | ICD-10-CM

## 2020-09-27 DIAGNOSIS — R1011 Right upper quadrant pain: Secondary | ICD-10-CM

## 2020-09-27 DIAGNOSIS — M25571 Pain in right ankle and joints of right foot: Secondary | ICD-10-CM

## 2020-09-27 LAB — POCT URINALYSIS DIP (CLINITEK)
Bilirubin, UA: NEGATIVE
Blood, UA: NEGATIVE
Glucose, UA: NEGATIVE mg/dL
Ketones, POC UA: NEGATIVE mg/dL
Leukocytes, UA: NEGATIVE
Nitrite, UA: NEGATIVE
POC PROTEIN,UA: NEGATIVE
Spec Grav, UA: >=1.03 — AB (ref 1.010–1.025)
Urobilinogen, UA: 0.2 E.U./dL
pH, UA: 5.5 (ref 5.0–8.0)

## 2020-09-27 LAB — POCT GLYCOSYLATED HEMOGLOBIN (HGB A1C): Hemoglobin A1C: 5.3 % (ref 4.0–5.6)

## 2020-09-27 LAB — GLUCOSE, POCT (MANUAL RESULT ENTRY): POC Glucose: 113 mg/dl — AB (ref 70–99)

## 2020-09-27 MED ORDER — AMLODIPINE BESYLATE 10 MG PO TABS: 10.0000 mg | ORAL_TABLET | Freq: Every day | ORAL | 0 refills | Status: DC

## 2020-09-27 MED ORDER — INDOMETHACIN 50 MG PO CAPS: 50.0000 mg | ORAL_CAPSULE | Freq: Three times a day (TID) | ORAL | 0 refills | Status: DC | PRN

## 2020-09-27 NOTE — Progress Notes (Signed)
Andre Garrett, is a 50 y.o. male  VZS:[id number]  QGB:[id number]  DOB - 29-Jun-1970  Subjective:  Chief Complaint and HPI: Andre Garrett is a 50 y.o. male here today t with complaints of pain to his right great toe since March of this year.  He was seen at the local urgent care because with gout was placed on on indomethacin to take as needed, patient says this medication helped but pain is still there, now is deforming his right foot, since his swelling up.  Patient says that he has been having abdominal pain, across his upper abdomen for the past 3 years.  He did not want any imaging due to lack of health insurance, but now he is ready to take this test.  Patient did not take his blood pressure medication and believes that is why his blood pressure is elevated this morning, he also says that he has chronic loose stools, he was seen by GI in the past, but did not want proceed with imaging due to lack of health insurance.  No chest pain or fever no nausea or vomiting, or constipation.  Did not take his blood pressure medications today.   ED/Hospital notes reviewed.    ROS:   Constitutional:  No f/c, No night sweats, No unexplained weight loss. EENT:  No vision changes, No blurry vision, No hearing changes. No mouth, throat, or ear problems.  Respiratory: No cough, No SOB Cardiac: No CP, no palpitations GI: Reports intermittent abd pain. No N/V/D. GU: No Urinary s/sx Musculoskeletal: Has pain to the joint of right great toe x49-monthNeuro: No headache, no dizziness, no motor weakness.  Endocrine:  No polydipsia. No polyuria.  Psych: Denies SI/HI  No problems updated.  ALLERGIES: Allergies  Allergen Reactions   Shellfish Allergy Nausea And Vomiting    PAST MEDICAL HISTORY: Past Medical History:  Diagnosis Date   Allergies    Hypertension     MEDICATIONS AT HOME: Prior to Admission medications   Medication Sig Start Date End Date Taking? Authorizing Provider  amLODipine  (NORVASC) 10 MG tablet Take 1 tablet (10 mg total) by mouth daily. 09/27/20   GFeliberto Gottron FNP  indomethacin (INDOCIN) 50 MG capsule Take 1 capsule (50 mg total) by mouth 3 (three) times daily as needed. 09/27/20   GFeliberto Gottron FNP  loratadine (CLARITIN) 10 MG tablet Take 10 mg by mouth daily.    [provider]     Objective:  EXAM:   Vitals:   09/27/20 0941  BP: (!) 178/96  Pulse: 72  Resp: 18  Temp: 98.1 F (36.7 C)  SpO2: 97%  Weight: 175 lb (79.4 kg)  Height: 5' 7.01" (1.702 m)    General appearance : A&OX3. NAD. Non-toxic-appearing HEENT: Atraumatic and Normocephalic.  PERRLA. EOM intact.  TM clear B. Mouth-MMM, post pharynx WNL w/o erythema, No PND. Neck: supple, no JVD. No cervical lymphadenopathy. No thyromegaly Chest/Lungs:  Breathing-non-labored, Good air entry bilaterally, breath sounds normal without rales, rhonchi, or wheezing  CVS: S1 S2 regular, no murmurs, gallops, rubs  Abdomen: Bowel sounds present, Non tender and not distended with no gaurding, rigidity or rebound. Extremities: Bilateral Lower Ext shows no edema, both legs are warm to touch with = pulse throughout.  There is small swelling to the right great toe joint.  No redness.  There is pain with palpation. Neurology:  CN II-XII grossly intact, Non focal.    Data Review Lab Results  Component Value Date   HGBA1C 5.3 09/27/2020  HGBA1C 6.1 (H) 02/02/2020   HGBA1C 5.6 12/13/2018     Assessment & Plan   1. Abdominal pain, right upper quadrant -CT of abdomen with contrast - POCT URINALYSIS DIP (CLINITEK) - POCT URINALYSIS DIP (CLINITEK) - CT Abdomen Pelvis Wo Contrast; Future - CMP14+EGFR  2. Prediabetes -Eat healthy food avoid any greasy foods or fast foods -Avoid any simple sugar drinks - POCT glucose (manual entry) - POCT glycosylated hemoglobin (Hb A1C)  3. Pain in joint involving right ankle and foot - Ambulatory referral to Podiatry - indomethacin (INDOCIN) 50 MG  capsule; Take 1 capsule (50 mg total) by mouth 3 (three) times daily as needed.  Dispense: 30 capsule; Refill: 0 -Wear comfortable shoes  4. Essential hypertension -Take medications as prescribed - amLODipine (NORVASC) 10 MG tablet; Take 1 tablet (10 mg total) by mouth daily.  Dispense: 90 tablet; Refill: 0     Patient have been counseled extensively about nutrition and exercise  No follow-ups on file.  The patient was given clear instructions to go to ER or return to medical center if symptoms don't improve, worsen or new problems develop. The patient verbalized understanding. The patient was told to call to get lab results if they haven't heard anything in the next week.     Feliberto Gottron, APRN, FNP-C [facility] and [facility], [place]   09/27/2020, 10:26 AM

## 2020-09-27 NOTE — Progress Notes (Signed)
Pt presents for bilateral side pain, painful urination denies any nausea or vomiting issue has been going on for several weeks.  Pt has knot on right foot that is painful w/ambulation

## 2020-09-27 NOTE — Patient Instructions (Signed)
Abdominal pain, right upper quadrant -CT of abdomen with contrast - POCT URINALYSIS DIP (CLINITEK) - POCT URINALYSIS DIP (CLINITEK) - CT Abdomen Pelvis Wo Contrast; Future - CMP14+EGFR  2. Prediabetes -Eat healthy food avoid any greasy foods or fast foods -Avoid any simple sugar drinks - POCT glucose (manual entry) - POCT glycosylated hemoglobin (Hb A1C)  3. Pain in joint involving right ankle and foot - Ambulatory referral to Podiatry - indomethacin (INDOCIN) 50 MG capsule; Take 1 capsule (50 mg total) by mouth 3 (three) times daily as needed.  Dispense: 30 capsule; Refill: 0 -Wear comfortable shoes  4. Essential hypertension -Take medications as prescribed - amLODipine (NORVASC) 10 MG tablet; Take 1 tablet (10 mg total) by mouth daily.  Dispense: 90 tablet; Refill: 0

## 2020-09-28 LAB — CMP14+EGFR
ALT: 14 IU/L (ref 0–44)
AST: 18 IU/L (ref 0–40)
Albumin/Globulin Ratio: 1.8 (ref 1.2–2.2)
Albumin: 4.8 g/dL (ref 4.0–5.0)
Alkaline Phosphatase: 99 IU/L (ref 44–121)
BUN/Creatinine Ratio: 13 (ref 9–20)
BUN: 11 mg/dL (ref 6–24)
Bilirubin Total: 0.8 mg/dL (ref 0.0–1.2)
CO2: 16 mmol/L — ABNORMAL LOW (ref 20–29)
Calcium: 10.1 mg/dL (ref 8.7–10.2)
Chloride: 99 mmol/L (ref 96–106)
Creatinine, Ser: 0.85 mg/dL (ref 0.76–1.27)
Globulin, Total: 2.6 g/dL (ref 1.5–4.5)
Glucose: 76 mg/dL (ref 65–99)
Potassium: 4.1 mmol/L (ref 3.5–5.2)
Sodium: 140 mmol/L (ref 134–144)
Total Protein: 7.4 g/dL (ref 6.0–8.5)
eGFR: 106 mL/min/{1.73_m2} (ref >59)

## 2020-10-04 ENCOUNTER — Telehealth: Payer: Self-pay | Admitting: *Deleted

## 2020-10-04 NOTE — Telephone Encounter (Addendum)
-----   Message from Eleonore Chiquito, FNP sent at 10/04/2020 10:34 AM EDT ----- Please notify the patient that her labs are stable.  We will repeat them in the next 6 months.  Minded her to call or come back if she has any question or concern regarding her health.  Still waiting for CT of abdomen results.   Thanks

## 2020-10-04 NOTE — Telephone Encounter (Signed)
Pt name and DOB verified. Patient aware of results and result note per ProviderGiramata, FNP.  Scheduled appt for CT Scan- 10/12/2020 at 0830. Will need to p/u prep from [facility] day before procedure.

## 2020-10-05 ENCOUNTER — Ambulatory Visit: Payer: Self-pay | Admitting: Podiatry

## 2020-10-12 ENCOUNTER — Ambulatory Visit (HOSPITAL_COMMUNITY): Payer: Self-pay

## 2020-10-22 ENCOUNTER — Other Ambulatory Visit: Payer: Self-pay

## 2020-10-22 ENCOUNTER — Ambulatory Visit (HOSPITAL_COMMUNITY)
Admission: RE | Admit: 2020-10-22 | Discharge: 2020-10-22 | Disposition: A | Discharge status: Home / Self Care | Payer: Self-pay | Source: Ambulatory Visit | Attending: Family | Admitting: Family

## 2020-10-22 DIAGNOSIS — R1011 Right upper quadrant pain: Secondary | ICD-10-CM | POA: Insufficient documentation

## 2020-12-28 ENCOUNTER — Ambulatory Visit: Payer: Self-pay | Admitting: Family Medicine

## 2021-01-17 ENCOUNTER — Other Ambulatory Visit: Payer: Self-pay | Admitting: Family

## 2021-01-17 DIAGNOSIS — I10 Essential (primary) hypertension: Secondary | ICD-10-CM

## 2021-01-17 NOTE — Telephone Encounter (Signed)
Requested medication (s) are due for refill today: Yes  Requested medication (s) are on the active medication list: Yes  Last refill:  09/27/20 #90/0RF  Future visit scheduled: No  Notes to clinic:  Unable to refill per protocol, last refill by another provider. PCP is at [facility]      Requested Prescriptions  Pending Prescriptions Disp Refills   amLODipine (NORVASC) 10 MG tablet [Pharmacy Med Name: amLODIPine Besylate 10 MG Oral Tablet] 90 tablet 0    Sig: Take 1 tablet by mouth once daily     There is no refill protocol information for this order

## 2021-02-11 ENCOUNTER — Ambulatory Visit (INDEPENDENT_AMBULATORY_CARE_PROVIDER_SITE_OTHER): Payer: Self-pay | Admitting: Family Medicine

## 2021-02-11 ENCOUNTER — Other Ambulatory Visit: Payer: Self-pay

## 2021-02-11 VITALS — BP 166/97 | HR 96 | Temp 98.1°F | Resp 16 | Ht 67.0 in | Wt 179.0 lb

## 2021-02-11 DIAGNOSIS — I10 Essential (primary) hypertension: Secondary | ICD-10-CM

## 2021-02-11 DIAGNOSIS — R140 Abdominal distension (gaseous): Secondary | ICD-10-CM

## 2021-02-11 DIAGNOSIS — Z23 Encounter for immunization: Secondary | ICD-10-CM

## 2021-02-11 DIAGNOSIS — R21 Rash and other nonspecific skin eruption: Secondary | ICD-10-CM

## 2021-02-11 DIAGNOSIS — L0292 Furuncle, unspecified: Secondary | ICD-10-CM

## 2021-02-11 MED ORDER — DICYCLOMINE HCL 20 MG PO TABS: 20.0000 mg | ORAL_TABLET | Freq: Four times a day (QID) | ORAL | 1 refills | Status: DC

## 2021-02-11 MED ORDER — AMLODIPINE BESYLATE 10 MG PO TABS: 10.0000 mg | ORAL_TABLET | Freq: Every day | ORAL | 0 refills | Status: DC

## 2021-02-11 MED ORDER — CEPHALEXIN 500 MG PO CAPS: 500.0000 mg | ORAL_CAPSULE | Freq: Three times a day (TID) | ORAL | 0 refills | Status: DC

## 2021-02-11 MED ORDER — TRIAMCINOLONE ACETONIDE 0.5 % EX CREA: 1.0000 "application " | TOPICAL_CREAM | Freq: Three times a day (TID) | CUTANEOUS | 0 refills | Status: DC

## 2021-02-11 NOTE — Progress Notes (Signed)
Patient is here for follow-HTN  Patient is still having issues after after getting CAT scan. Patient said it feels like something is grabbing his ribs on both sides. Patient said it feels like he's always bloated.

## 2021-02-13 NOTE — Progress Notes (Signed)
Established Patient Office Visit  Subjective:  Patient ID: Andre Garrett, male    DOB: [birth year]  Age: 50 y.o. MRN: [id number]  CC:  Chief Complaint  Patient presents with   Follow-up   Hypertension    HPI SANDON YOHO presents for follow up of hypertension and also complains of bloating and intermittent abdominal cramping. Also reports that he has a rash and also a boil. Denies known contacts or exposures.   Past Medical History:  Diagnosis Date   Allergies    Hypertension     Past Surgical History:  Procedure Laterality Date   NASAL SEPTUM SURGERY  2008   WISDOM TOOTH EXTRACTION     age 34     Family History  Problem Relation Age of Onset   Diabetes Mother    Diabetes Father    Hypertension Father    Diabetes Brother    Hypertension Brother    Diabetes Maternal Grandmother    Skin cancer Maternal Grandmother    Diabetes Maternal Grandfather    Diabetes Paternal Grandmother    Diabetes Paternal Grandfather    Stomach cancer Maternal Uncle     Social History   Socioeconomic History   Marital status: Significant Other    Spouse name: Not on file   Number of children: Not on file   Years of education: Not on file   Highest education level: Not on file  Occupational History   Not on file  Tobacco Use   Smoking status: Former    Types: Cigarettes    Quit date: 2014    Years since quitting: 8.9   Smokeless tobacco: Never  Vaping Use   Vaping Use: Never used  Substance and Sexual Activity   Alcohol use: Yes    Comment: rare    Drug use: Never   Sexual activity: Not on file  Other Topics Concern   Not on file  Social History Narrative   Not on file   Social Determinants of Health   Financial Resource Strain: Not on file  Food Insecurity: Not on file  Transportation Needs: Not on file  Physical Activity: Not on file  Stress: Not on file  Social Connections: Not on file  Intimate Partner Violence: Not on file    ROS Review of Systems   Gastrointestinal:  Positive for abdominal distention and abdominal pain. Negative for blood in stool, constipation, diarrhea, nausea and rectal pain.  All other systems reviewed and are negative.  Objective:   Today's Vitals: BP (!) 166/97 Comment: pt has not had medication in 2 wks   Pulse 96    Temp 98.1 F (36.7 C) (Oral)    Resp 16    Ht 5\' 7"  (1.702 m)    Wt 179 lb (81.2 kg)    BMI 28.04 kg/m   Physical Exam Vitals and nursing note reviewed.  Constitutional:      General: He is not in acute distress. Cardiovascular:     Rate and Rhythm: Normal rate and regular rhythm.  Pulmonary:     Effort: Pulmonary effort is normal.     Breath sounds: Normal breath sounds.  Abdominal:     Palpations: Abdomen is soft. There is no mass.     Tenderness: There is no abdominal tenderness.  Skin:    Findings: Abscess and rash present.  Neurological:     General: No focal deficit present.     Mental Status: He is alert and oriented to person, place, and time.  Assessment & Plan:   1. Abdominal bloating Bentyl prescribed. Recommend diet diary.   2. Essential hypertension Elevated reading. Compliance discussed. Amlodipine 10 mg refilled.  - amLODipine (NORVASC) 10 MG tablet; Take 1 tablet (10 mg total) by mouth daily.  Dispense: 90 tablet; Refill: 0  3. Boil Keflex prescribed.  - cephALEXin (KEFLEX) 500 MG capsule; Take 1 capsule (500 mg total) by mouth 3 (three) times daily.  Dispense: 30 capsule; Refill: 0  4. Rash and nonspecific skin eruption Kenalog cream prescribed. monitor - triamcinolone cream (KENALOG) 0.5 %; Apply 1 application topically 3 (three) times daily.  Dispense: 30 g; Refill: 0  Outpatient Encounter Medications as of 02/11/2021  Medication Sig   cephALEXin (KEFLEX) 500 MG capsule Take 1 capsule (500 mg total) by mouth 3 (three) times daily.   dicyclomine (BENTYL) 20 MG tablet Take 1 tablet (20 mg total) by mouth every 6 (six) hours.   triamcinolone cream  (KENALOG) 0.5 % Apply 1 application topically 3 (three) times daily.   amLODipine (NORVASC) 10 MG tablet Take 1 tablet (10 mg total) by mouth daily.   indomethacin (INDOCIN) 50 MG capsule Take 1 capsule (50 mg total) by mouth 3 (three) times daily as needed.   loratadine (CLARITIN) 10 MG tablet Take 10 mg by mouth daily.   [DISCONTINUED] amLODipine (NORVASC) 10 MG tablet Take 1 tablet (10 mg total) by mouth daily.   No facility-administered encounter medications on file as of 02/11/2021.    Follow-up: Return in about 4 weeks (around 03/11/2021) for follow up.   Tommie Raymond, MD

## 2021-05-22 ENCOUNTER — Ambulatory Visit: Payer: Self-pay | Admitting: *Deleted

## 2021-05-22 NOTE — Telephone Encounter (Signed)
?  Chief Complaint: abdominal bloating requesting appt  ?Symptoms: right side abdominal area and rib area pain, bloating and moving to left side. Worsening pain, sharp. ?Frequency: several weeks , started after having covid in 2021 ?Pertinent Negatives: Patient denies chest pain , difficulty breathing, no fever, no N/V can eat and drink ?Disposition: [] ED /[] Urgent Care (no appt availability in office) / [x] Appointment(In office/virtual)/ []  [facility]/ [] Home Care/ [] Refused Recommended Disposition /[] [facility]/ []  Follow-up with PCP ?Additional Notes:  ?Advised if pain worsens or fever noted go to UC/ED . ? ? ?Reason for Disposition ? Abdominal pain is a chronic symptom (recurrent or ongoing AND present > 4 weeks) ? ?Answer Assessment - Initial Assessment Questions ?1. LOCATION: "Where does it hurt?"  ?    Right side around rib cage and moving to left side  ?2. RADIATION: "Does the pain shoot anywhere else?" (e.g., chest, back) ?    Goes to left side  ?3. ONSET: "When did the pain begin?" (Minutes, hours or days ago)  ?    Several weeks  ?4. SUDDEN: "Gradual or sudden onset?" ?    Na  ?5. PATTERN "Does the pain come and go, or is it constant?" ?   - If constant: "Is it getting better, staying the same, or worsening?"  ?    (Note: Constant means the pain never goes away completely; most serious pain is constant and it progresses)  ?   - If intermittent: "How long does it last?" "Do you have pain now?" ?    (Note: Intermittent means the pain goes away completely between bouts) ?    Constant  ?6. SEVERITY: "How bad is the pain?"  (e.g., Scale 1-10; mild, moderate, or severe) ?   - MILD (1-3): doesn't interfere with normal activities, abdomen soft and not tender to touch  ?   - MODERATE (4-7): interferes with normal activities or awakens from sleep, abdomen tender to touch  ?   - SEVERE (8-10): excruciating pain, doubled over, unable to do any normal activities   ?    Moderate pain not tender  to touch  ?7. RECURRENT SYMPTOM: "Have you ever had this type of stomach pain before?" If Yes, ask: "When was the last time?" and "What happened that time?"  ?    Yes since having covid in 2021 ?8. CAUSE: "What do you think is causing the stomach pain?" ?    na ?9. RELIEVING/AGGRAVATING FACTORS: "What makes it better or worse?" (e.g., movement, antacids, bowel movement) ?    na ?10. OTHER SYMPTOMS: "Do you have any other symptoms?" (e.g., back pain, diarrhea, fever, urination pain, vomiting) ?      Feels something right side ? ?Protocols used: Abdominal Pain - Male-A-AH ? ?

## 2021-06-12 ENCOUNTER — Ambulatory Visit (INDEPENDENT_AMBULATORY_CARE_PROVIDER_SITE_OTHER): Payer: Self-pay | Admitting: Family Medicine

## 2021-06-12 ENCOUNTER — Encounter: Payer: Self-pay | Admitting: Family Medicine

## 2021-06-12 VITALS — BP 142/96 | HR 96 | Temp 98.0°F | Resp 16 | Wt 176.2 lb

## 2021-06-12 DIAGNOSIS — R1011 Right upper quadrant pain: Secondary | ICD-10-CM

## 2021-06-12 DIAGNOSIS — I10 Essential (primary) hypertension: Secondary | ICD-10-CM

## 2021-06-12 MED ORDER — AMLODIPINE BESYLATE 10 MG PO TABS: 10.0000 mg | ORAL_TABLET | Freq: Every day | ORAL | 2 refills | Status: DC

## 2021-06-12 NOTE — Progress Notes (Signed)
Patient is here because he has pain in his upper abdominal  area. Patient said it feels like something is pulling is ribs. Patient said this has been going on for sometimes. ?

## 2021-06-13 ENCOUNTER — Encounter: Payer: Self-pay | Admitting: Family Medicine

## 2021-06-13 NOTE — Progress Notes (Signed)
? ?Established Patient Office Visit ? ?Subjective:  ?Patient ID: Andre Garrett, male    DOB: 1971/01/25  Age: 51 y.o. MRN: [id number] ? ?CC:  ?Chief Complaint  ?Patient presents with  ? Abdominal Pain  ? ? ?HPI ?Andre Garrett presents for follow up of abdominal pain. Patient reports that he has an appt scheduled out with consultant later rather than sooner 2/2 $$. His symptoms persist.  ? ?Past Medical History:  ?Diagnosis Date  ? Allergies   ? Hypertension   ? ? ?Past Surgical History:  ?Procedure Laterality Date  ? NASAL SEPTUM SURGERY  2008  ? WISDOM TOOTH EXTRACTION    ? age 25   ? ? ?Family History  ?Problem Relation Age of Onset  ? Diabetes Mother   ? Diabetes Father   ? Hypertension Father   ? Diabetes Brother   ? Hypertension Brother   ? Diabetes Maternal Grandmother   ? Skin cancer Maternal Grandmother   ? Diabetes Maternal Grandfather   ? Diabetes Paternal Grandmother   ? Diabetes Paternal Grandfather   ? Stomach cancer Maternal Uncle   ? ? ?Social History  ? ?Socioeconomic History  ? Marital status: Significant Other  ?  Spouse name: Not on file  ? Number of children: Not on file  ? Years of education: Not on file  ? Highest education level: Not on file  ?Occupational History  ? Not on file  ?Tobacco Use  ? Smoking status: Former  ?  Types: Cigarettes  ?  Quit date: 2014  ?  Years since quitting: 9.2  ? Smokeless tobacco: Never  ?Vaping Use  ? Vaping Use: Never used  ?Substance and Sexual Activity  ? Alcohol use: Yes  ?  Comment: rare   ? Drug use: Never  ? Sexual activity: Not on file  ?Other Topics Concern  ? Not on file  ?Social History Narrative  ? Not on file  ? ?Social Determinants of Health  ? ?Financial Resource Strain: Not on file  ?Food Insecurity: Not on file  ?Transportation Needs: Not on file  ?Physical Activity: Not on file  ?Stress: Not on file  ?Social Connections: Not on file  ?Intimate Partner Violence: Not on file  ? ? ?ROS ?Review of Systems  ?Constitutional:  Negative for chills and  fever.  ?Gastrointestinal:  Positive for abdominal pain. Negative for constipation, diarrhea and nausea.  ?All other systems reviewed and are negative. ? ?Objective:  ? ?Today's Vitals: BP (!) 142/96   Pulse 96   Temp 98 ?F (36.7 ?C) (Oral)   Resp 16   Wt 176 lb 3.2 oz (79.9 kg)   SpO2 94%   BMI 27.60 kg/m?  ? ?Physical Exam ?Vitals and nursing note reviewed.  ?Constitutional:   ?   General: He is not in acute distress. ?Cardiovascular:  ?   Rate and Rhythm: Normal rate and regular rhythm.  ?Pulmonary:  ?   Effort: Pulmonary effort is normal.  ?   Breath sounds: Normal breath sounds.  ?Abdominal:  ?   Palpations: Abdomen is soft. There is no mass.  ?   Tenderness: There is abdominal tenderness in the right upper quadrant.  ?Neurological:  ?   General: No focal deficit present.  ?   Mental Status: He is alert and oriented to person, place, and time.  ? ? ?Assessment & Plan:  ? ?1. RUQ abdominal pain ?Referral to GI For further eval/mgt ?- Ambulatory referral to Gastroenterology ? ?2. Essential hypertension ?  Elevated readings.Norvasc prescribed. monitor ?- amLODipine (NORVASC) 10 MG tablet; Take 1 tablet (10 mg total) by mouth daily.  Dispense: 90 tablet; Refill: 2  ? ? ?Outpatient Encounter Medications as of 06/12/2021  ?Medication Sig  ? cephALEXin (KEFLEX) 500 MG capsule Take 1 capsule (500 mg total) by mouth 3 (three) times daily.  ? dicyclomine (BENTYL) 20 MG tablet Take 1 tablet (20 mg total) by mouth every 6 (six) hours.  ? indomethacin (INDOCIN) 50 MG capsule Take 1 capsule (50 mg total) by mouth 3 (three) times daily as needed.  ? loratadine (CLARITIN) 10 MG tablet Take 10 mg by mouth daily.  ? triamcinolone cream (KENALOG) 0.5 % Apply 1 application topically 3 (three) times daily.  ? [DISCONTINUED] amLODipine (NORVASC) 10 MG tablet Take 1 tablet (10 mg total) by mouth daily.  ? amLODipine (NORVASC) 10 MG tablet Take 1 tablet (10 mg total) by mouth daily.  ? ?No facility-administered encounter  medications on file as of 06/12/2021.  ? ? ?Follow-up: No follow-ups on file.  ? ?Tommie Raymond, MD ? ?

## 2021-07-18 ENCOUNTER — Ambulatory Visit
Admission: EM | Admit: 2021-07-18 | Discharge: 2021-07-18 | Disposition: A | Discharge status: Home / Self Care | Payer: Self-pay | Attending: Family Medicine | Admitting: Family Medicine

## 2021-07-18 DIAGNOSIS — R350 Frequency of micturition: Secondary | ICD-10-CM | POA: Insufficient documentation

## 2021-07-18 LAB — POCT URINALYSIS DIP (MANUAL ENTRY)
Bilirubin, UA: NEGATIVE
Blood, UA: NEGATIVE
Glucose, UA: NEGATIVE mg/dL
Ketones, POC UA: NEGATIVE mg/dL
Leukocytes, UA: NEGATIVE
Nitrite, UA: NEGATIVE
Protein Ur, POC: NEGATIVE mg/dL
Spec Grav, UA: >=1.03 — AB (ref 1.010–1.025)
Urobilinogen, UA: 0.2 E.U./dL
pH, UA: 5.5 (ref 5.0–8.0)

## 2021-07-18 LAB — POCT FASTING CBG KUC MANUAL ENTRY: POCT Glucose (KUC): 115 mg/dL — AB (ref 70–99)

## 2021-07-18 NOTE — ED Provider Notes (Signed)
[facility]    CSN: [id number] Arrival date & time: 07/18/21  1546      History   Chief Complaint Chief Complaint  Patient presents with   Urinary Frequency   dry mouth     HPI Andre Garrett is a 51 y.o. male.    Urinary Frequency  Here for dark urine that began 5/15, (states color of ginger ale). Then had been having urinary frequency and some polydipsia. Mouth has felt dry.   Tingling in legs for a week. Family h/o DM  Has had some upper abdominal discomfort, and evaluated with CT 10/2020. Was expcecting GI referral, but has not heard. Referral put in Epic 4/12, I cannot tell if appt has been set.  Past Medical History:  Diagnosis Date   Allergies    Hypertension     Patient Active Problem List   Diagnosis Date Noted   Lung nodule 08/22/2012   Alcohol abuse 08/21/2012   Tobacco abuse 08/21/2012   HTN (hypertension) 08/21/2012   Allergic rhinitis 08/21/2012    Past Surgical History:  Procedure Laterality Date   NASAL SEPTUM SURGERY  2008   WISDOM TOOTH EXTRACTION     age 26        Home Medications    Prior to Admission medications   Medication Sig Start Date End Date Taking? Authorizing Provider  amLODipine (NORVASC) 10 MG tablet Take 1 tablet (10 mg total) by mouth daily. 06/12/21   Dorna Mai, MD  dicyclomine (BENTYL) 20 MG tablet Take 1 tablet (20 mg total) by mouth every 6 (six) hours. 02/11/21   Dorna Mai, MD  loratadine (CLARITIN) 10 MG tablet Take 10 mg by mouth daily.    [provider]  triamcinolone cream (KENALOG) 0.5 % Apply 1 application topically 3 (three) times daily. 02/11/21   Dorna Mai, MD    Family History Family History  Problem Relation Age of Onset   Diabetes Mother    Diabetes Father    Hypertension Father    Diabetes Brother    Hypertension Brother    Diabetes Maternal Grandmother    Skin cancer Maternal Grandmother    Diabetes Maternal Grandfather    Diabetes Paternal Grandmother     Diabetes Paternal Grandfather    Stomach cancer Maternal Uncle     Social History Social History   Tobacco Use   Smoking status: Former    Types: Cigarettes    Quit date: 2014    Years since quitting: 9.3   Smokeless tobacco: Never  Vaping Use   Vaping Use: Never used  Substance Use Topics   Alcohol use: Yes    Comment: rare past user   Drug use: Never     Allergies   Shellfish allergy   Review of Systems Review of Systems  Genitourinary:  Positive for frequency.    Physical Exam Triage Vital Signs ED Triage Vitals  Enc Vitals Group     BP 07/18/21 1607 (!) 156/96     Pulse Rate 07/18/21 1607 89     Resp 07/18/21 1607 20     Temp 07/18/21 1607 98.1 F (36.7 C)     Temp Source 07/18/21 1607 Oral     SpO2 07/18/21 1607 97 %     Weight --      Height --      Head Circumference --      Peak Flow --      Pain Score 07/18/21 1604 8     Pain  Loc --      Pain Edu? --      Excl. in [place]? --    No data found.  Updated Vital Signs BP (!) 156/96 (BP Location: Right Arm)   Pulse 89   Temp 98.1 F (36.7 C) (Oral)   Resp 20   SpO2 97%   Visual Acuity Right Eye Distance:   Left Eye Distance:   Bilateral Distance:    Right Eye Near:   Left Eye Near:    Bilateral Near:     Physical Exam   UC Treatments / Results  Labs (all labs ordered are listed, but only abnormal results are displayed) Labs Reviewed  POCT URINALYSIS DIP (MANUAL ENTRY) - Abnormal; Notable for the following components:      Result Value   Color, UA orange (*)    Spec Grav, UA >=1.030 (*)    All other components within normal limits  POCT FASTING CBG [facility] MANUAL ENTRY - Abnormal; Notable for the following components:   POCT Glucose ([facility]) 115 (*)    All other components within normal limits  URINE CULTURE  COMPREHENSIVE METABOLIC PANEL  CBC    EKG   Radiology No results found.  Procedures Procedures (including critical care time)  Medications Ordered in UC Medications - No  data to display  Initial Impression / Assessment and Plan / UC Course  I have reviewed the triage vital signs and the nursing notes.  Pertinent labs & imaging results that were available during my care of the patient were reviewed by me and considered in my medical decision making (see chart for details).     UA shows concentrated urine, sg >1.03. Neg sugar, neg WBC, negnitrite. Neg for blood. Orange color  Glucose is 115. Cmp and cbc drawn to further evaluate. Given contact info for [facility].  Warnings given for the ER. Final Clinical Impressions(s) / UC Diagnoses   Final diagnoses:  Urinary frequency     Discharge Instructions      Urinalysis was concentrated, possibly showing you are getting dehydrated. Urine culture is sent. If it looks like a urine infection, staff will call you.  Sugar was 115, not perfect, not showing you have diabetes.  Blood work has been drawn to check for sodium, potassium, kidney and liver function, anemia. Staff will call you if there are any significant abnormalities.  If you start feeling dizzy, can't get fluids in, feel worse, proceed to the ER.     ED Prescriptions   None    PDMP not reviewed this encounter.   Barrett Henle, MD 07/18/21 [id number]

## 2021-07-18 NOTE — Discharge Instructions (Addendum)
Urinalysis was concentrated, possibly showing you are getting dehydrated. Urine culture is sent. If it looks like a urine infection, staff will call you.  Sugar was 115, not perfect, not showing you have diabetes.  Blood work has been drawn to check for sodium, potassium, kidney and liver function, anemia. Staff will call you if there are any significant abnormalities.  If you start feeling dizzy, can't get fluids in, feel worse, proceed to the ER.

## 2021-07-18 NOTE — ED Triage Notes (Addendum)
Patient presents to Urgent Care with complaints of frequent urination, dark urine, dry mouth, tingling in legs since last week. Patient reports family hx of diabetes and is concerned.   Pt st he has been having pain in epigastric region that he has ct scan for that was inconclusive. Pt reports he feels bloated.

## 2021-07-19 LAB — URINE CULTURE: Culture: NO GROWTH

## 2021-07-19 LAB — COMPREHENSIVE METABOLIC PANEL
ALT: 24 IU/L (ref 0–44)
AST: 25 IU/L (ref 0–40)
Albumin/Globulin Ratio: 1.8 (ref 1.2–2.2)
Albumin: 4.8 g/dL (ref 3.8–4.9)
Alkaline Phosphatase: 88 IU/L (ref 44–121)
BUN/Creatinine Ratio: 19 (ref 9–20)
BUN: 19 mg/dL (ref 6–24)
Bilirubin Total: 1.2 mg/dL (ref 0.0–1.2)
CO2: 20 mmol/L (ref 20–29)
Calcium: 9.5 mg/dL (ref 8.7–10.2)
Chloride: 105 mmol/L (ref 96–106)
Creatinine, Ser: 1.01 mg/dL (ref 0.76–1.27)
Globulin, Total: 2.6 g/dL (ref 1.5–4.5)
Glucose: 108 mg/dL — ABNORMAL HIGH (ref 70–99)
Potassium: 4.3 mmol/L (ref 3.5–5.2)
Sodium: 139 mmol/L (ref 134–144)
Total Protein: 7.4 g/dL (ref 6.0–8.5)
eGFR: 90 mL/min/{1.73_m2} (ref >59)

## 2021-07-19 LAB — CBC
Hematocrit: 47.5 % (ref 37.5–51.0)
Hemoglobin: 16.3 g/dL (ref 13.0–17.7)
MCH: 31.1 pg (ref 26.6–33.0)
MCHC: 34.3 g/dL (ref 31.5–35.7)
MCV: 91 fL (ref 79–97)
Platelets: 212 10*3/uL (ref 150–450)
RBC: 5.24 x10E6/uL (ref 4.14–5.80)
RDW: 12 % (ref 11.6–15.4)
WBC: 6.4 10*3/uL (ref 3.4–10.8)

## 2021-09-04 ENCOUNTER — Encounter: Payer: Self-pay | Admitting: Family Medicine

## 2021-12-30 IMAGING — CT CT ABD-PELV W/O CM
2 of 4 series · 17 of 46 positions shown, 19 images · non-contrast
Comparison: Chest CT on 03/08/2018

CLINICAL DATA: Abdominal pain and bloating for approximately 8
months.

EXAM:
CT ABDOMEN AND PELVIS WITHOUT CONTRAST
TECHNIQUE: Multidetector CT imaging of the abdomen and pelvis was performed
following the standard protocol without IV contrast.

[Series 2: axial st · axial · 0.77mm/px · z∈[+1082,+1507]mm · 14 of 99 slices shown, 16 images]
[im 7/99  soft-tissue]
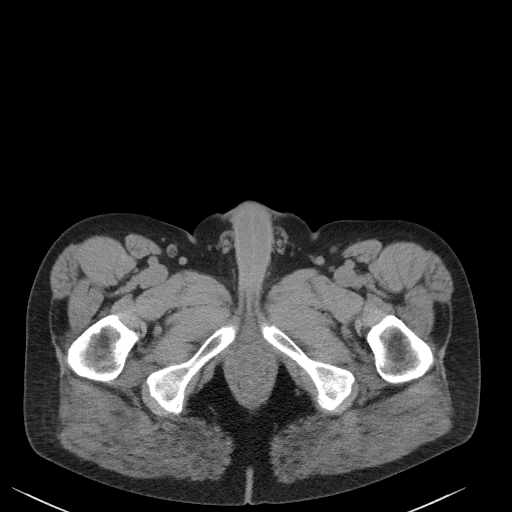
[im 7/99  bone]
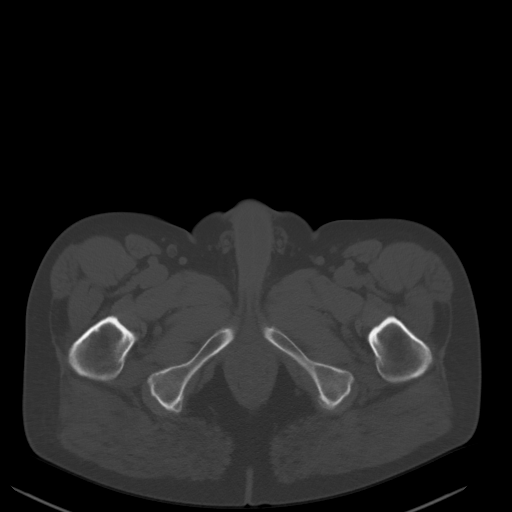
[im 14/99  soft-tissue]
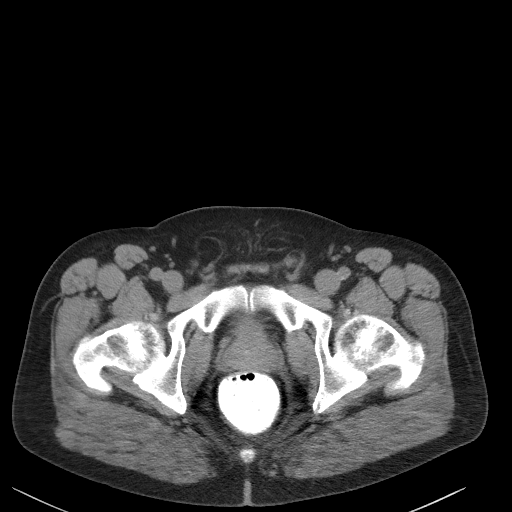
[im 20/99  soft-tissue]
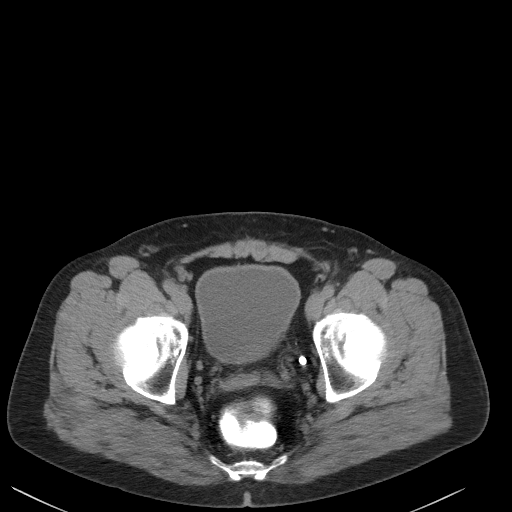
[im 27/99  soft-tissue]
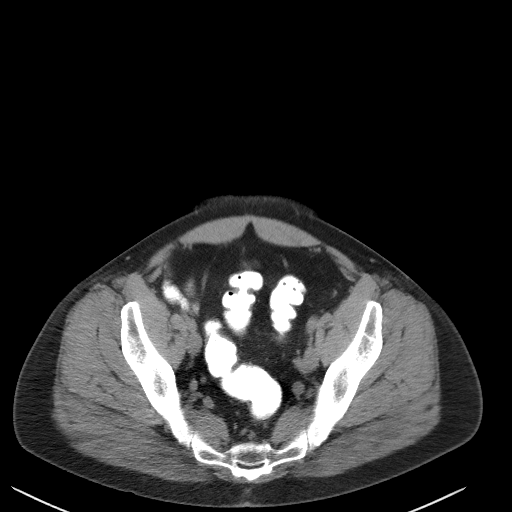
[im 33/99  soft-tissue]
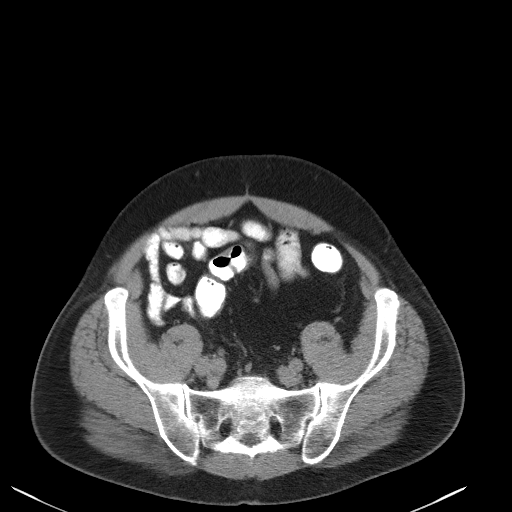
[im 40/99  soft-tissue]
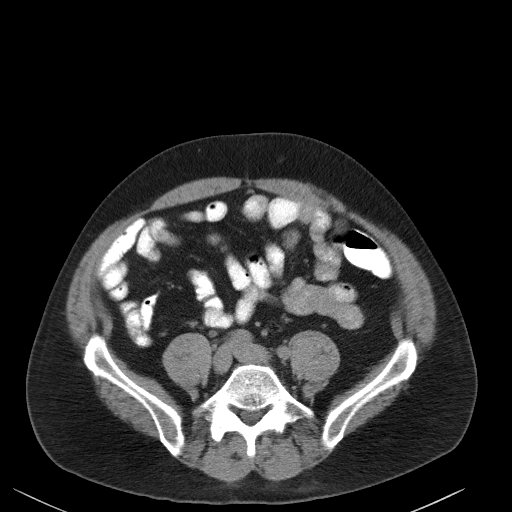
[im 46/99  soft-tissue]
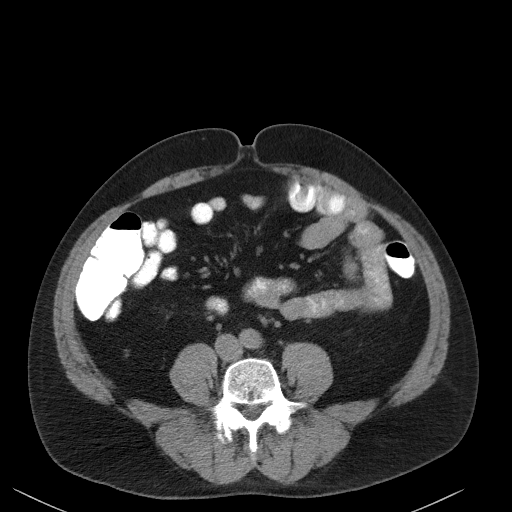
[im 53/99  soft-tissue]
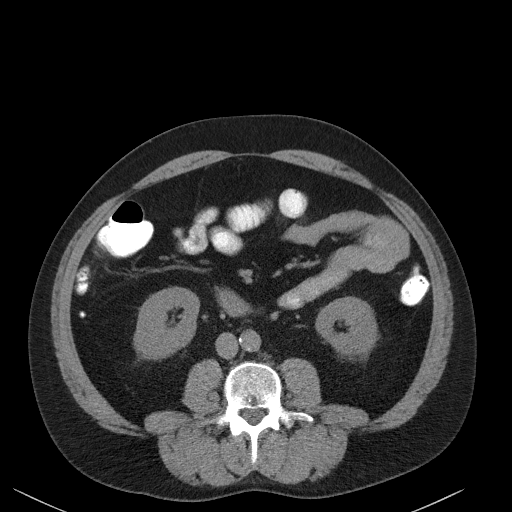
[im 59/99  soft-tissue]
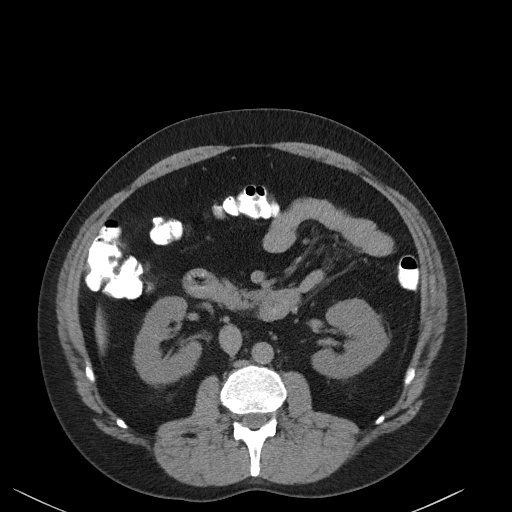
[im 59/99  bone]
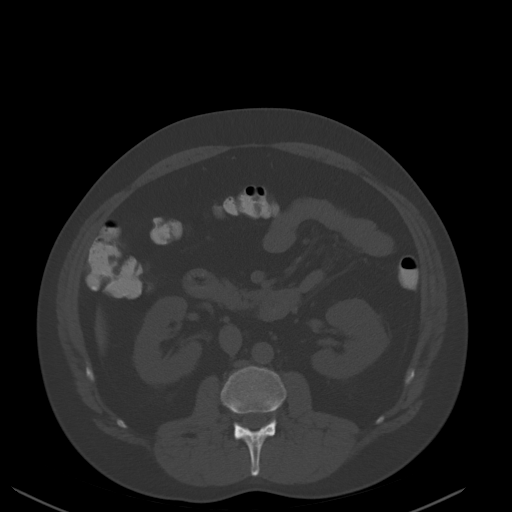
[im 66/99  soft-tissue]
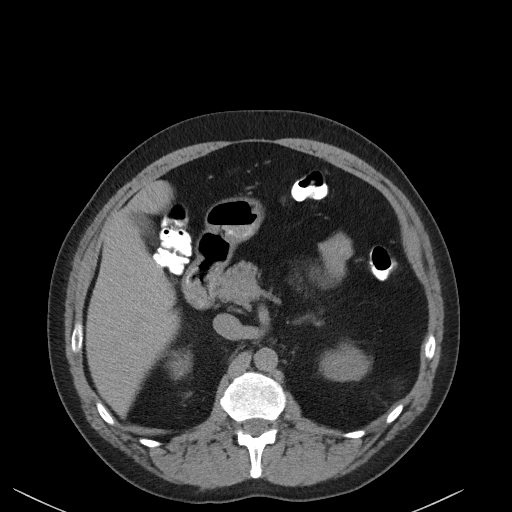
[im 72/99  soft-tissue]
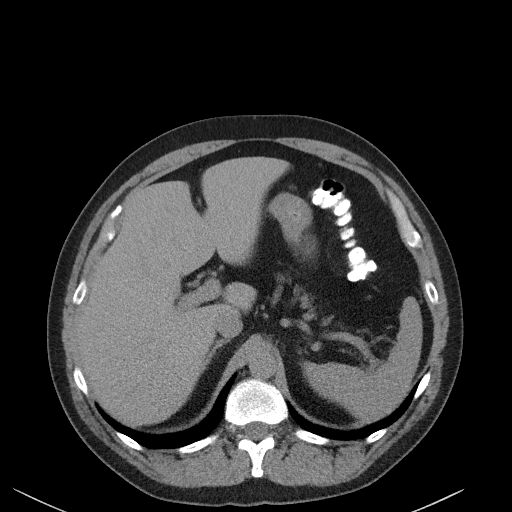
[im 79/99  soft-tissue]
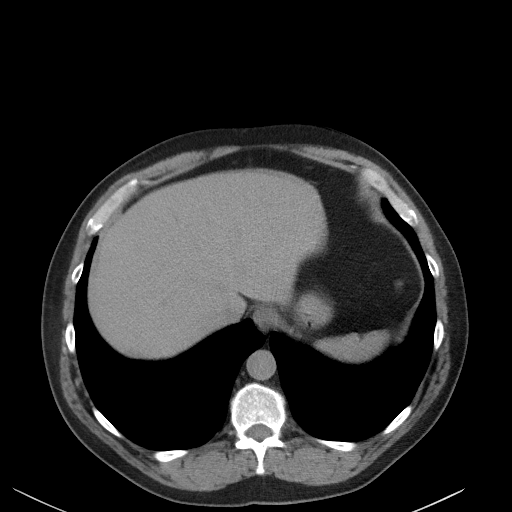
[im 85/99  soft-tissue]
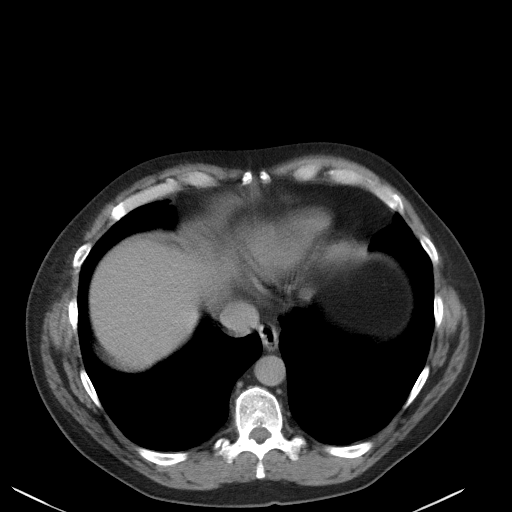
[im 92/99  soft-tissue]
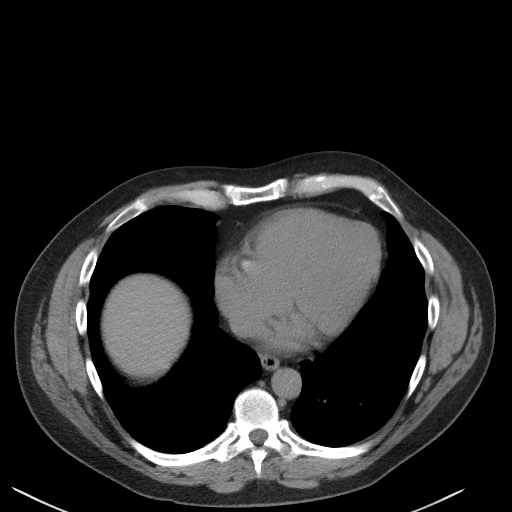

[Series 5: coronal st · coronal · 0.91mm/px · 3 of 101 slices shown]
[im 34/101  soft-tissue]
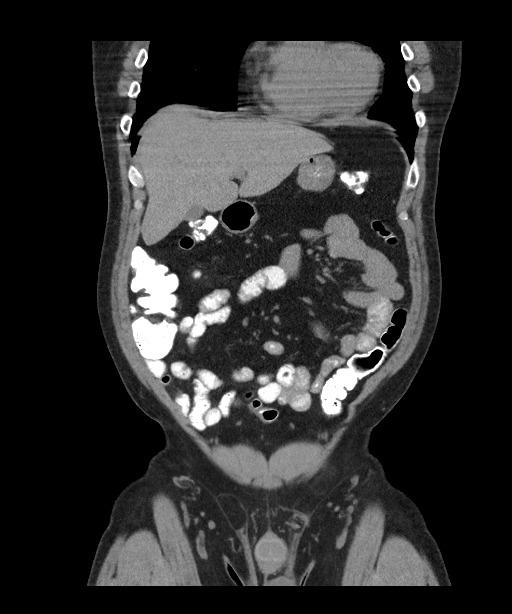
[im 45/101  soft-tissue]
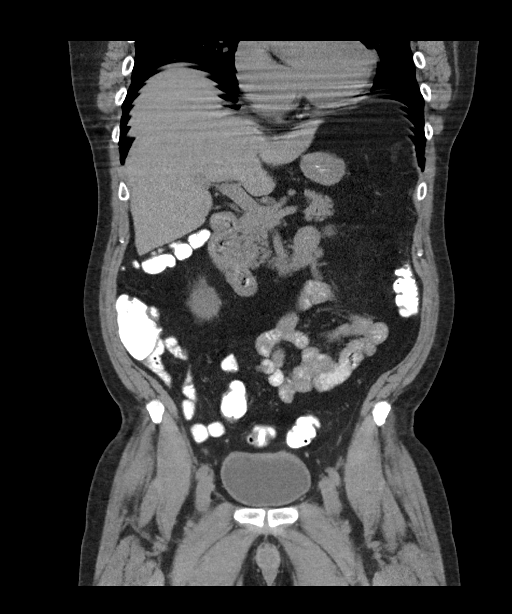
[im 56/101  soft-tissue]
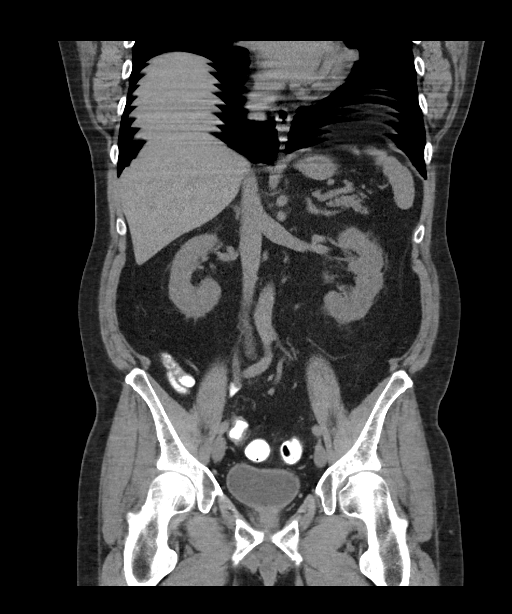

[17 of 46 positions shown; findings below may reference images not displayed]

FINDINGS: Lower chest: No acute findings. 4 mm pulmonary nodule in the
inferior right middle lobe remains stable, consistent with benign
etiology.

Hepatobiliary: No mass visualized on this unenhanced exam.
Gallbladder is unremarkable. No evidence of biliary ductal
dilatation.

Pancreas: No mass or inflammatory process visualized on this
unenhanced exam.

Spleen:  Within normal limits in size.

Adrenals/Urinary tract: No evidence of urolithiasis or
hydronephrosis. Unremarkable unopacified urinary bladder.

Stomach/Bowel: No evidence of obstruction, inflammatory process, or
abnormal fluid collections. Normal appendix visualized.

Vascular/Lymphatic: No pathologically enlarged lymph nodes
identified. No evidence of abdominal aortic aneurysm. Aortic
atherosclerotic calcification noted.

Reproductive:  No mass or other significant abnormality.

Other:  None.

Musculoskeletal:  No suspicious bone lesions identified.
IMPRESSION: No acute findings or other significant abnormality.

Aortic Atherosclerosis (C32SL-1LY.Y).

## 2022-03-17 ENCOUNTER — Other Ambulatory Visit: Payer: Self-pay | Admitting: Family Medicine

## 2022-03-17 DIAGNOSIS — I10 Essential (primary) hypertension: Secondary | ICD-10-CM

## 2022-06-09 ENCOUNTER — Encounter: Payer: Self-pay | Admitting: Physician Assistant

## 2022-06-13 ENCOUNTER — Ambulatory Visit: Payer: Self-pay | Admitting: Physician Assistant

## 2022-07-03 ENCOUNTER — Other Ambulatory Visit: Payer: Self-pay | Admitting: Family Medicine

## 2022-07-03 DIAGNOSIS — I10 Essential (primary) hypertension: Secondary | ICD-10-CM

## 2022-07-04 NOTE — Telephone Encounter (Signed)
Patient needs OV, will refill medication for 30 days until OV can be made. OV needed for additional refills.  Requested Prescriptions  Pending Prescriptions Disp Refills   amLODipine (NORVASC) 10 MG tablet [Pharmacy Med Name: amLODIPine Besylate 10 MG Oral Tablet] 30 tablet 0    Sig: Take 1 tablet by mouth once daily     Cardiovascular: Calcium Channel Blockers 2 Failed - 07/03/2022  6:34 PM      Failed - Last BP in normal range    BP Readings from Last 1 Encounters:  07/18/21 (!) 156/96         Failed - Valid encounter within last 6 months    Recent Outpatient Visits           1 year ago RUQ abdominal pain   [facility], MD   1 year ago Abdominal bloating   [facility], MD   1 year ago Abdominal pain, right upper quadrant   [facility], FNP   3 years ago Essential hypertension   [facility], Kandee Keen, MD   3 years ago Abdominal pain, right upper quadrant    [facility] [place], [place]              Passed - Last Heart Rate in normal range    Pulse Readings from Last 1 Encounters:  07/18/21 89

## 2022-07-11 ENCOUNTER — Ambulatory Visit: Payer: Self-pay | Admitting: Gastroenterology

## 2022-08-04 ENCOUNTER — Ambulatory Visit: Payer: Self-pay | Admitting: Physician Assistant

## 2022-08-08 ENCOUNTER — Ambulatory Visit: Payer: Self-pay | Admitting: Physician Assistant

## 2022-10-27 ENCOUNTER — Ambulatory Visit: Payer: Self-pay | Admitting: Nurse Practitioner

## 2023-01-01 ENCOUNTER — Ambulatory Visit
Admission: EM | Admit: 2023-01-01 | Discharge: 2023-01-01 | Disposition: A | Discharge status: Home / Self Care | Payer: Self-pay | Attending: Physician Assistant | Admitting: Physician Assistant

## 2023-01-01 DIAGNOSIS — I10 Essential (primary) hypertension: Secondary | ICD-10-CM

## 2023-01-01 DIAGNOSIS — M109 Gout, unspecified: Secondary | ICD-10-CM

## 2023-01-01 MED ORDER — INDOMETHACIN 50 MG PO CAPS: 50.0000 mg | ORAL_CAPSULE | Freq: Two times a day (BID) | ORAL | 0 refills | Status: DC

## 2023-01-01 MED ORDER — AMLODIPINE BESYLATE 10 MG PO TABS: 10.0000 mg | ORAL_TABLET | Freq: Every day | ORAL | 0 refills | Status: DC

## 2023-01-01 NOTE — Discharge Instructions (Addendum)
  Please follow up with PCP for further refills of amlodipine.

## 2023-01-01 NOTE — ED Triage Notes (Signed)
"  I am having a Gout flare up of pain in my right toe". This started back "yesterday". No fever. No injury.

## 2023-01-01 NOTE — ED Provider Notes (Signed)
[facility]    CSN: [id number] Arrival date & time: 01/01/23  0803      History   Chief Complaint Chief Complaint  Patient presents with   Gout Flareup    HPI Andre Garrett is a 52 y.o. male.   Patient here today for evaluation of gout flare to his right great toe.  He reports that when he has gout flares it has always his right great toe.  He notes symptoms started yesterday and he has significant pain with putting on his shoe.  He notes he had been taking tart cherry supplement and had ran out and thinks this might of been what triggered symptoms.  He denies any fever.  He has not any injury.  He also request refill of his blood pressure medications.  He denies any chest pain or shortness of breath.  He has not any side effects from medication.  He did run out of this medication as well.  The history is provided by the patient.    Past Medical History:  Diagnosis Date   Allergies    Gout    Hypertension    Prediabetes     Patient Active Problem List   Diagnosis Date Noted   Lung nodule 08/22/2012   Alcohol abuse 08/21/2012   Tobacco abuse 08/21/2012   HTN (hypertension) 08/21/2012   Allergic rhinitis 08/21/2012    Past Surgical History:  Procedure Laterality Date   NASAL SEPTUM SURGERY  2008   WISDOM TOOTH EXTRACTION     age 24        Home Medications    Prior to Admission medications   Medication Sig Start Date End Date Taking? Authorizing Provider  indomethacin (INDOCIN) 50 MG capsule Take 1 capsule (50 mg total) by mouth 2 (two) times daily with a meal. 01/01/23  Yes Tomi Bamberger, PA-C  loratadine (CLARITIN) 10 MG tablet Take 10 mg by mouth daily.   Yes [provider]  amLODipine (NORVASC) 10 MG tablet Take 1 tablet (10 mg total) by mouth daily. 01/01/23   Tomi Bamberger, PA-C  dicyclomine (BENTYL) 20 MG tablet Take 1 tablet (20 mg total) by mouth every 6 (six) hours. 02/11/21   Georganna Skeans, MD  triamcinolone cream  (KENALOG) 0.5 % Apply 1 application topically 3 (three) times daily. 02/11/21   Georganna Skeans, MD    Family History Family History  Problem Relation Age of Onset   Diabetes Mother    Diabetes Father    Hypertension Father    Diabetes Brother    Hypertension Brother    Diabetes Maternal Grandmother    Skin cancer Maternal Grandmother    Diabetes Maternal Grandfather    Diabetes Paternal Grandmother    Diabetes Paternal Grandfather    Stomach cancer Maternal Uncle     Social History Social History   Tobacco Use   Smoking status: Former    Current packs/day: 0.00    Types: Cigarettes    Quit date: 2014    Years since quitting: 10.8   Smokeless tobacco: Never  Vaping Use   Vaping status: Never Used  Substance Use Topics   Alcohol use: Yes    Comment: rare past user   Drug use: Never     Allergies   Shellfish allergy   Review of Systems Review of Systems  Constitutional:  Negative for chills and fever.  Eyes:  Negative for discharge and redness.  Respiratory:  Negative for shortness of breath.   Cardiovascular:  Negative for chest pain.  Musculoskeletal:  Positive for arthralgias and joint swelling.  Skin:  Positive for color change. Negative for wound.  Neurological:  Negative for numbness.     Physical Exam Triage Vital Signs ED Triage Vitals  Encounter Vitals Group     BP 01/01/23 0819 (!) 175/100     Systolic BP Percentile --      Diastolic BP Percentile --      Pulse Rate 01/01/23 0819 86     Resp 01/01/23 0819 18     Temp 01/01/23 0819 98.4 F (36.9 C)     Temp Source 01/01/23 0819 Oral     SpO2 01/01/23 0819 96 %     Weight 01/01/23 0817 180 lb (81.6 kg)     Height 01/01/23 0817 5\' 7"  (1.702 m)     Head Circumference --      Peak Flow --      Pain Score 01/01/23 0815 9     Pain Loc --      Pain Education --      Exclude from Growth Chart --    No data found.  Updated Vital Signs BP (!) 175/100 (BP Location: Left Arm)   Pulse 86    Temp 98.4 F (36.9 C) (Oral)   Resp 18   Ht 5\' 7"  (1.702 m)   Wt 180 lb (81.6 kg)   SpO2 96%   BMI 28.19 kg/m       Physical Exam Vitals and nursing note reviewed.  Constitutional:      General: He is not in acute distress.    Appearance: Normal appearance. He is not ill-appearing.  HENT:     Head: Normocephalic and atraumatic.  Eyes:     Conjunctiva/sclera: Conjunctivae normal.  Cardiovascular:     Rate and Rhythm: Normal rate.  Pulmonary:     Effort: Pulmonary effort is normal. No respiratory distress.  Musculoskeletal:     Comments: Diffuse swelling and redness to right great first MTP with tenderness palpation of same.  Skin:    Capillary Refill: Normal cap refill to right great toe. Neurological:     Mental Status: He is alert.     Comments: Gross sensation intact to distal right great toe  Psychiatric:        Mood and Affect: Mood normal.        Behavior: Behavior normal.        Thought Content: Thought content normal.      UC Treatments / Results  Labs (all labs ordered are listed, but only abnormal results are displayed) Labs Reviewed - No data to display  EKG   Radiology No results found.  Procedures Procedures (including critical care time)  Medications Ordered in UC Medications - No data to display  Initial Impression / Assessment and Plan / UC Course  I have reviewed the triage vital signs and the nursing notes.  Pertinent labs & imaging results that were available during my care of the patient were reviewed by me and considered in my medical decision making (see chart for details).    Indomethacin prescribed for gout flare as this has helped in the past.  Amlodipine refilled at prior dosage with recommendation to follow-up with PCP regarding hypertension treatment.  Encouraged follow-up if no gradual improvement or with any further concerns.  Final Clinical Impressions(s) / UC Diagnoses   Final diagnoses:  Acute gout involving toe of  right foot, unspecified cause  Primary hypertension  Discharge Instructions       Please follow up with PCP for further refills of amlodipine.     ED Prescriptions     Medication Sig Dispense Auth. Provider   indomethacin (INDOCIN) 50 MG capsule Take 1 capsule (50 mg total) by mouth 2 (two) times daily with a meal. 30 capsule Erma Pinto F, PA-C   amLODipine (NORVASC) 10 MG tablet Take 1 tablet (10 mg total) by mouth daily. 30 tablet Tomi Bamberger, PA-C      PDMP not reviewed this encounter.   Tomi Bamberger, PA-C 01/01/23 [phone]

## 2023-01-07 ENCOUNTER — Ambulatory Visit: Payer: Self-pay | Admitting: Physician Assistant

## 2023-04-13 ENCOUNTER — Encounter: Payer: Self-pay | Admitting: Physician Assistant

## 2023-04-13 ENCOUNTER — Ambulatory Visit: Payer: Self-pay | Admitting: Physician Assistant

## 2023-05-26 ENCOUNTER — Encounter: Payer: Self-pay | Admitting: Physician Assistant

## 2023-05-26 ENCOUNTER — Ambulatory Visit (INDEPENDENT_AMBULATORY_CARE_PROVIDER_SITE_OTHER): Payer: Self-pay | Admitting: Physician Assistant

## 2023-05-26 ENCOUNTER — Other Ambulatory Visit: Payer: Self-pay | Admitting: Family Medicine

## 2023-05-26 VITALS — BP 164/110 | HR 88 | Ht 67.0 in | Wt 180.4 lb

## 2023-05-26 DIAGNOSIS — K529 Noninfective gastroenteritis and colitis, unspecified: Secondary | ICD-10-CM

## 2023-05-26 DIAGNOSIS — I10 Essential (primary) hypertension: Secondary | ICD-10-CM

## 2023-05-26 DIAGNOSIS — R194 Change in bowel habit: Secondary | ICD-10-CM

## 2023-05-26 DIAGNOSIS — R1013 Epigastric pain: Secondary | ICD-10-CM

## 2023-05-26 DIAGNOSIS — K625 Hemorrhage of anus and rectum: Secondary | ICD-10-CM

## 2023-05-26 DIAGNOSIS — G8929 Other chronic pain: Secondary | ICD-10-CM

## 2023-05-26 NOTE — Telephone Encounter (Signed)
 Copied from CRM [id number]. Topic: Clinical - Medication Refill >> May 26, 2023  4:48 PM Priscille Loveless wrote: Most Recent Primary Care Visit:  Provider: Georganna Skeans  Department: [facility]  Visit Type: OFFICE VISIT  Date: 06/12/2021  Medication: amLODipine (NORVASC) 10 MG tablet  Has the patient contacted their pharmacy? Yes (Agent: If no, request that the patient contact the pharmacy for the refill. If patient does not wish to contact the pharmacy document the reason why and proceed with request.) (Agent: If yes, when and what did the pharmacy advise?)  Is this the correct pharmacy for this prescription? Yes If no, delete pharmacy and type the correct one.  This is the patient's preferred pharmacy:  [facility] [address] ([address] [employer] - [address] [place] ([place]) [place] [address] Phone: [phone] Fax: [phone]   Has the prescription been filled recently? Yes  Is the patient out of the medication? Yes  Has the patient been seen for an appointment in the last year OR does the patient have an upcoming appointment? Yes  Can we respond through MyChart? Yes  Agent: Please be advised that Rx refills may take up to 3 business days. We ask that you follow-up with your pharmacy.

## 2023-05-26 NOTE — Patient Instructions (Signed)
 You have been scheduled for a colonoscopy. Please follow written instructions given to you at your visit today.   If you use inhalers (even only as needed), please bring them with you on the day of your procedure.  DO NOT TAKE 7 DAYS PRIOR TO TEST- Trulicity (dulaglutide) Ozempic, Wegovy (semaglutide) Mounjaro (tirzepatide) Bydureon Bcise (exanatide extended release)  DO NOT TAKE 1 DAY PRIOR TO YOUR TEST Rybelsus (semaglutide) Adlyxin (lixisenatide) Victoza (liraglutide) Byetta (exanatide) _______________________________________________________  If your blood pressure at your visit was 140/90 or greater, please contact your primary care physician to follow up on this.  _______________________________________________________  If you are age 39 or older, your body mass index should be between 23-30. Your Body mass index is 28.25 kg/m. If this is out of the aforementioned range listed, please consider follow up with your Primary Care Provider.  If you are age 61 or younger, your body mass index should be between 19-25. Your Body mass index is 28.25 kg/m. If this is out of the aformentioned range listed, please consider follow up with your Primary Care Provider.   ________________________________________________________  The [facility] providers would like to encourage you to use [facility] to communicate with providers for non-urgent requests or questions.  Due to long hold times on the telephone, sending your provider a message by [facility] may be a faster and more efficient way to get a response.  Please allow 48 business hours for a response.  Please remember that this is for non-urgent requests.  _______________________________________________________

## 2023-05-26 NOTE — Progress Notes (Signed)
 Chief Complaint: Chronic epigastric abdominal pain  HPI:    Andre Garrett is a 53 year old male, previously known to Dr. Christella Hartigan, with a past medical history as listed below, who was referred to me by Georganna Skeans, MD for a complaint of chronic epigastric abdominal pain.      02/07/2019 office visit with Dr. Christella Hartigan for chronic diarrhea and right sided abdominal pain, at that time 2 watery bowel movements a day, also right upper quadrant pain for months, used to drink a sixpack of beer every other day but quit drinking a year prior.  At that time discussed possible biliary colic.  Recommended abdominal ultrasound.  Also recommended once daily Imodium, stool studies and a colonoscopy.    10/22/2020 CT the abdomen pelvis without contrast for abdominal pain and bloating for 8 months with no acute findings.    07/18/2021 CMP and CBC normal.    Today, the patient presents to clinic and tells me that he has had a right sided pain that now radiates across his epigastrium into the left side for years, 10+ years.  Over the past 2 months it seems to have gotten worse where it is really across the entirety of his upper stomach and he describes this as squeezing right under his rib cage rated as a 7/10 in severity.  It sometimes wakes him up from sleep.  He has never tried anything over-the-counter for it including any NSAIDs or Tylenol.  Nothing seems to make this pain better or worse.      Also describes that he always has diarrhea, he never has formed stool and will have up to 3 stools a day.  Occasionally sees blood on the toilet paper when wiping.    Denies family history of colon cancer or IBD but does describe a history of stomach cancer in an uncle.  He works as a Naval architect.    Denies nausea, vomiting, heartburn, reflux.  Past Medical History:  Diagnosis Date   Allergies    Gout    Hypertension    Prediabetes     Past Surgical History:  Procedure Laterality Date   NASAL SEPTUM SURGERY  2008    WISDOM TOOTH EXTRACTION     age 54     Current Outpatient Medications  Medication Sig Dispense Refill   amLODipine (NORVASC) 10 MG tablet Take 1 tablet (10 mg total) by mouth daily. 30 tablet 0   loratadine (CLARITIN) 10 MG tablet Take 10 mg by mouth daily.     No current facility-administered medications for this visit.    Allergies as of 05/26/2023 - Review Complete 05/26/2023  Allergen Reaction Noted   Shellfish allergy Nausea And Vomiting 08/21/2012    Family History  Problem Relation Age of Onset   Diabetes Mother    Diabetes Father    Hypertension Father    Diabetes Brother    Hypertension Brother    Diabetes Maternal Grandmother    Skin cancer Maternal Grandmother    Diabetes Maternal Grandfather    Diabetes Paternal Grandmother    Diabetes Paternal Grandfather    Stomach cancer Maternal Uncle     Social History   Socioeconomic History   Marital status: Significant Other    Spouse name: Not on file   Number of children: Not on file   Years of education: Not on file   Highest education level: Not on file  Occupational History   Not on file  Tobacco Use   Smoking status: Former  Current packs/day: 0.00    Types: Cigarettes    Quit date: 2014    Years since quitting: 11.2   Smokeless tobacco: Never  Vaping Use   Vaping status: Never Used  Substance and Sexual Activity   Alcohol use: Yes    Comment: rare past user   Drug use: Never   Sexual activity: Not Currently  Other Topics Concern   Not on file  Social History Narrative   Not on file   Social Drivers of Health   Financial Resource Strain: Not on file  Food Insecurity: Not on file  Transportation Needs: Not on file  Physical Activity: Not on file  Stress: Not on file  Social Connections: Not on file  Intimate Partner Violence: Not on file    Review of Systems:    Constitutional: No weight loss, fever or chills Skin: No rash  Cardiovascular: No chest pain Respiratory: No SOB   Gastrointestinal: See HPI and otherwise negative Genitourinary: No dysuria  Neurological: No headache, dizziness or syncope Musculoskeletal: No new muscle or joint pain Hematologic: No bruising Psychiatric: No history of depression or anxiety   Physical Exam:  Vital signs: BP (!) 164/110   Pulse 88   Ht 5\' 7"  (1.702 m)   Wt 180 lb 6.4 oz (81.8 kg)   BMI 28.25 kg/m    Constitutional:   Pleasant Caucasian male appears to be in NAD, Well developed, Well nourished, alert and cooperative Head:  Normocephalic and atraumatic. Eyes:   PEERL, EOMI. No icterus. Conjunctiva pink. Ears:  Normal auditory acuity. Neck:  Supple Throat: Oral cavity and pharynx without inflammation, swelling or lesion.  Respiratory: Respirations even and unlabored. Lungs clear to auscultation bilaterally.   No wheezes, crackles, or rhonchi.  Cardiovascular: Normal S1, S2. No MRG. Regular rate and rhythm. No peripheral edema, cyanosis or pallor.  Gastrointestinal:  Soft, nondistended, nontender. No rebound or guarding. Normal bowel sounds. No appreciable masses or hepatomegaly. Rectal:  Not performed.  Msk:  Symmetrical without gross deformities. Without edema, no deformity or joint abnormality.  Neurologic:  Alert and  oriented x4;  grossly normal neurologically.  Skin:   Dry and intact without significant lesions or rashes. Psychiatric: Demonstrates good judgement and reason without abnormal affect or behaviors.  RELEVANT LABS AND IMAGING: CBC    Component Value Date/Time   WBC 6.4 07/18/2021 1717   WBC 6.2 02/16/2018 1348   RBC 5.24 07/18/2021 1717   RBC 5.12 02/16/2018 1348   HGB 16.3 07/18/2021 1717   HCT 47.5 07/18/2021 1717   PLT 212 07/18/2021 1717   MCV 91 07/18/2021 1717   MCH 31.1 07/18/2021 1717   MCH 30.9 02/16/2018 1348   MCHC 34.3 07/18/2021 1717   MCHC 33.1 02/16/2018 1348   RDW 12.0 07/18/2021 1717   LYMPHSABS 1.7 12/13/2018 1634   MONOABS 0.6 08/21/2012 1321   EOSABS 0.1 12/13/2018  1634   BASOSABS 0.1 12/13/2018 1634    CMP     Component Value Date/Time   NA 139 07/18/2021 1717   K 4.3 07/18/2021 1717   CL 105 07/18/2021 1717   CO2 20 07/18/2021 1717   GLUCOSE 108 (H) 07/18/2021 1717   GLUCOSE 117 (H) 02/16/2018 1348   BUN 19 07/18/2021 1717   CREATININE 1.01 07/18/2021 1717   CALCIUM 9.5 07/18/2021 1717   PROT 7.4 07/18/2021 1717   ALBUMIN 4.8 07/18/2021 1717   AST 25 07/18/2021 1717   ALT 24 07/18/2021 1717   ALKPHOS 88 07/18/2021 1717  BILITOT 1.2 07/18/2021 1717   GFRNONAA 57 (L) 02/02/2020 1722   GFRAA 65 02/02/2020 1722    Assessment: 1.  Chronic epigastric pain: Described as a squeezing pain right underneath his rib cage, does not worsen with palpation but he still feels it, previous CT and labs were normal; consider musculoskeletal versus IBD versus chronic gastritis versus other 2.  Chronic diarrhea: This has been going on for years, previously a colonoscopy was recommended by Dr. Christella Hartigan but never pursued, no real change in symptoms over the past few years but continues with watery stool, previous labs including CBC and CMP normal, previous CT normal; consider IBD versus IBS  Plan: 1.  Scheduled patient for diagnostic EGD and colonoscopy.  Did provide the patient a detailed list of risks for the procedures and he agrees to proceed. Patient is appropriate for endoscopic procedure(s) in the ambulatory ([facility]) setting.  2.  Patient does tell me he does not have insurance and was given information about patient assistance.  We will also have to call him with the cost prediction and possible payment plan if available. 3.  Patient to follow in clinic per recommendations after procedures.  He was assigned to Dr. Leone Payor today.  Hyacinth Meeker, PA-C [facility] 05/26/2023, 10:49 AM  Cc: Georganna Skeans, MD

## 2023-05-28 NOTE — Telephone Encounter (Signed)
 Requested medication (s) are due for refill today: yes  Requested medication (s) are on the active medication list: yes  Last refill:  01/01/23  Future visit scheduled: no  Notes to clinic:  Unable to refill per protocol, last refill by another provider.      Requested Prescriptions  Pending Prescriptions Disp Refills   amLODipine (NORVASC) 10 MG tablet 30 tablet 0    Sig: Take 1 tablet (10 mg total) by mouth daily.     Cardiovascular: Calcium Channel Blockers 2 Failed - 05/28/2023 11:18 AM      Failed - Last BP in normal range    BP Readings from Last 1 Encounters:  05/26/23 (!) 164/110         Failed - Valid encounter within last 6 months    Recent Outpatient Visits           1 year ago RUQ abdominal pain   [facility], MD   2 years ago Abdominal bloating   [facility], MD   2 years ago Abdominal pain, right upper quadrant   [facility], FNP   3 years ago Essential hypertension   [facility], Kandee Keen, DO   4 years ago Abdominal pain, right upper quadrant   [facility] [place], MD              Passed - Last Heart Rate in normal range    Pulse Readings from Last 1 Encounters:  05/26/23 88

## 2023-06-01 ENCOUNTER — Other Ambulatory Visit: Payer: Self-pay | Admitting: *Deleted

## 2023-06-01 ENCOUNTER — Ambulatory Visit: Payer: Self-pay | Admitting: *Deleted

## 2023-06-01 DIAGNOSIS — I10 Essential (primary) hypertension: Secondary | ICD-10-CM

## 2023-06-01 MED ORDER — AMLODIPINE BESYLATE 10 MG PO TABS: 10.0000 mg | ORAL_TABLET | Freq: Every day | ORAL | 0 refills | Status: DC

## 2023-06-01 NOTE — Telephone Encounter (Signed)
Patient given 30 day courtesy refill until appointment  with Dr.Wilson  

## 2023-06-01 NOTE — Telephone Encounter (Signed)
  Chief Complaint: Patient is requesting refill of BP medication- he is out.  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   Disposition: [] ED /[] Urgent Care (no appt availability in office) / [] Appointment(In office/virtual)/ []  [name]/ [] Home Care/ [] Refused Recommended Disposition /[] [facility]/ [x]  Follow-up with PCP Additional Notes: Patient is requesting a bridge Rx to get him to his appointment with PCP- [name]. Unable to fill Rx- not filled by provider at last refill.    Copied from CRM [id number]. Topic: Clinical - Prescription Issue >> Jun 01, 2023  2:08 PM Antwanette L wrote: Reason for CRM: Patient is currently out of amLODipine (NORVASC) 10 MG tablet and needs a refill. Pt last bp check on 3/25 152/100 Reason for Disposition  [1] Prescription refill request for ESSENTIAL medicine (i.e., likelihood of harm to patient if not taken) AND [2] triager unable to refill per department policy  Answer Assessment - Initial Assessment Questions 1. DRUG NAME: "What medicine do you need to have refilled?"     Amlodipine 10 mg 2. REFILLS REMAINING: "How many refills are remaining?" (Note: The label on the medicine or pill bottle will show how many refills are remaining. If there are no refills remaining, then a renewal may be needed.)     none 3. EXPIRATION DATE: "What is the expiration date?" (Note: The label states when the prescription will expire, and thus can no longer be refilled.)     na 4. PRESCRIBING HCP: "Who prescribed it?" Reason: If prescribed by specialist, call should be referred to that group.     PCP 5. SYMPTOMS: "Do you have any symptoms?"     Patient states UC next door gave him 30 day supply( he is out now)- he was not aware he needed to come in every 6 months. Patient has scheduled a follow up  appointment( 07/07/23- first available). Patient is requesting refill- he has DOT physical 07/18/23 and needs to stay on this medication.  Protocols used: Medication Refill and Renewal Call-A-AH

## 2023-06-30 ENCOUNTER — Encounter: Payer: Self-pay | Admitting: Internal Medicine

## 2023-07-07 ENCOUNTER — Encounter: Payer: Self-pay | Admitting: Family Medicine

## 2023-07-07 ENCOUNTER — Ambulatory Visit (INDEPENDENT_AMBULATORY_CARE_PROVIDER_SITE_OTHER): Payer: Self-pay | Admitting: Family Medicine

## 2023-07-07 VITALS — BP 151/87 | HR 99 | Wt 174.2 lb

## 2023-07-07 DIAGNOSIS — Z87891 Personal history of nicotine dependence: Secondary | ICD-10-CM

## 2023-07-07 DIAGNOSIS — I10 Essential (primary) hypertension: Secondary | ICD-10-CM

## 2023-07-07 DIAGNOSIS — M1A9XX1 Chronic gout, unspecified, with tophus (tophi): Secondary | ICD-10-CM

## 2023-07-07 MED ORDER — PREDNISONE 10 MG (21) PO TBPK: ORAL_TABLET | ORAL | 0 refills | Status: AC

## 2023-07-07 MED ORDER — AMLODIPINE BESYLATE 10 MG PO TABS: 10.0000 mg | ORAL_TABLET | Freq: Every day | ORAL | 1 refills | Status: AC

## 2023-07-07 MED ORDER — COLCHICINE 0.6 MG PO TABS: 0.6000 mg | ORAL_TABLET | Freq: Every day | ORAL | 1 refills | Status: AC

## 2023-07-09 ENCOUNTER — Encounter: Payer: Self-pay | Admitting: Family Medicine

## 2023-07-09 NOTE — Progress Notes (Signed)
 Established Patient Office Visit  Subjective    Patient ID: Andre Garrett, male    DOB: 1970-05-03  Age: 53 y.o. MRN: [id number]  CC:  Chief Complaint  Patient presents with   Hypertension   Gout    HPI Andre Garrett presents for follow up of hypertension. Patient also reports some recurrent got sx.   Outpatient Encounter Medications as of 07/07/2023  Medication Sig   colchicine 0.6 MG tablet Take 1 tablet (0.6 mg total) by mouth daily.   loratadine  (CLARITIN ) 10 MG tablet Take 10 mg by mouth daily.   predniSONE (STERAPRED UNI-PAK 21 TAB) 10 MG (21) TBPK tablet Take po as directed   [DISCONTINUED] amLODipine  (NORVASC ) 10 MG tablet Take 1 tablet (10 mg total) by mouth daily.   amLODipine  (NORVASC ) 10 MG tablet Take 1 tablet (10 mg total) by mouth daily.   No facility-administered encounter medications on file as of 07/07/2023.    Past Medical History:  Diagnosis Date   Allergies    Gout    Hypertension    Prediabetes     Past Surgical History:  Procedure Laterality Date   NASAL SEPTUM SURGERY  2008   WISDOM TOOTH EXTRACTION     age 70     Family History  Problem Relation Age of Onset   Diabetes Mother    Diabetes Father    Hypertension Father    Diabetes Brother    Hypertension Brother    Diabetes Maternal Grandmother    Skin cancer Maternal Grandmother    Diabetes Maternal Grandfather    Diabetes Paternal Grandmother    Diabetes Paternal Grandfather    Stomach cancer Maternal Uncle     Social History   Socioeconomic History   Marital status: Significant Other    Spouse name: Not on file   Number of children: Not on file   Years of education: Not on file   Highest education level: Not on file  Occupational History   Not on file  Tobacco Use   Smoking status: Former    Current packs/day: 0.00    Types: Cigarettes    Quit date: 2014    Years since quitting: 11.3   Smokeless tobacco: Never  Vaping Use   Vaping status: Never Used  Substance and  Sexual Activity   Alcohol use: Yes    Comment: rare past user   Drug use: Never   Sexual activity: Not Currently  Other Topics Concern   Not on file  Social History Narrative   Not on file   Social Drivers of Health   Financial Resource Strain: Not on file  Food Insecurity: Not on file  Transportation Needs: Not on file  Physical Activity: Not on file  Stress: Not on file  Social Connections: Not on file  Intimate Partner Violence: Not on file    Review of Systems  All other systems reviewed and are negative.       Objective    BP (!) 151/87 (BP Location: Left Arm, Patient Position: Sitting, Cuff Size: Normal)   Pulse 99   Wt 174 lb 3.2 oz (79 kg)   SpO2 96%   BMI 27.28 kg/m   Physical Exam Vitals and nursing note reviewed.  Constitutional:      General: He is not in acute distress. Cardiovascular:     Rate and Rhythm: Normal rate and regular rhythm.  Pulmonary:     Effort: Pulmonary effort is normal.     Breath sounds: Normal  breath sounds.  Abdominal:     Palpations: Abdomen is soft.     Tenderness: There is no abdominal tenderness.  Neurological:     General: No focal deficit present.     Mental Status: He is alert and oriented to person, place, and time.         Assessment & Plan:   Essential hypertension -     amLODIPine  Besylate; Take 1 tablet (10 mg total) by mouth daily.  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  Dispense: 30 tablet; Refill: 1     Return in about 6 months (around 01/07/2024) for follow up, chronic med issues.   Arlo Lama, MD

## 2024-01-07 ENCOUNTER — Ambulatory Visit: Payer: Self-pay | Admitting: Family Medicine
